# Patient Record
Sex: Male | Born: 1955
Health system: Southern US, Community
[De-identification: ages and names within clinical notes are randomized; demographics above are authoritative.]

## PROBLEM LIST (undated history)

## (undated) DIAGNOSIS — F329 Major depressive disorder, single episode, unspecified: Secondary | ICD-10-CM

## (undated) DIAGNOSIS — E782 Mixed hyperlipidemia: Secondary | ICD-10-CM

## (undated) DIAGNOSIS — E119 Type 2 diabetes mellitus without complications: Secondary | ICD-10-CM

## (undated) DIAGNOSIS — G4733 Obstructive sleep apnea (adult) (pediatric): Secondary | ICD-10-CM

## (undated) DIAGNOSIS — I714 Abdominal aortic aneurysm, without rupture, unspecified: Secondary | ICD-10-CM

## (undated) DIAGNOSIS — J449 Chronic obstructive pulmonary disease, unspecified: Secondary | ICD-10-CM

## (undated) DIAGNOSIS — K219 Gastro-esophageal reflux disease without esophagitis: Secondary | ICD-10-CM

## (undated) DIAGNOSIS — E11319 Type 2 diabetes mellitus with unspecified diabetic retinopathy without macular edema: Secondary | ICD-10-CM

## (undated) DIAGNOSIS — I639 Cerebral infarction, unspecified: Secondary | ICD-10-CM

## (undated) DIAGNOSIS — J984 Other disorders of lung: Secondary | ICD-10-CM

## (undated) DIAGNOSIS — G629 Polyneuropathy, unspecified: Secondary | ICD-10-CM

## (undated) DIAGNOSIS — H35039 Hypertensive retinopathy, unspecified eye: Secondary | ICD-10-CM

## (undated) DIAGNOSIS — I429 Cardiomyopathy, unspecified: Secondary | ICD-10-CM

## (undated) DIAGNOSIS — I340 Nonrheumatic mitral (valve) insufficiency: Secondary | ICD-10-CM

## (undated) DIAGNOSIS — I499 Cardiac arrhythmia, unspecified: Secondary | ICD-10-CM

## (undated) DIAGNOSIS — S82402B Unspecified fracture of shaft of left fibula, initial encounter for open fracture type I or II: Secondary | ICD-10-CM

## (undated) DIAGNOSIS — F32A Depression, unspecified: Secondary | ICD-10-CM

## (undated) DIAGNOSIS — I10 Essential (primary) hypertension: Secondary | ICD-10-CM

## (undated) DIAGNOSIS — H269 Unspecified cataract: Secondary | ICD-10-CM

## (undated) DIAGNOSIS — H3530 Unspecified macular degeneration: Secondary | ICD-10-CM

## (undated) DIAGNOSIS — S82202B Unspecified fracture of shaft of left tibia, initial encounter for open fracture type I or II: Secondary | ICD-10-CM

## (undated) DIAGNOSIS — D649 Anemia, unspecified: Secondary | ICD-10-CM

## (undated) HISTORY — DX: Hypertensive retinopathy, unspecified eye: H35.039

## (undated) HISTORY — PX: CHOLECYSTECTOMY: SHX55

## (undated) HISTORY — DX: Anemia, unspecified: D64.9

## (undated) HISTORY — PX: PELVIC FRACTURE SURGERY: SHX119

## (undated) HISTORY — DX: Cardiomyopathy, unspecified: I42.9

## (undated) HISTORY — DX: Other disorders of lung: J98.4

## (undated) HISTORY — DX: Gastro-esophageal reflux disease without esophagitis: K21.9

## (undated) HISTORY — PX: EXPLORATORY LAPAROTOMY: SUR591

## (undated) HISTORY — DX: Abdominal aortic aneurysm, without rupture: I71.4

## (undated) HISTORY — DX: Cerebral infarction, unspecified: I63.9

## (undated) HISTORY — DX: Depression, unspecified: F32.A

## (undated) HISTORY — PX: OTHER SURGICAL HISTORY: SHX169

## (undated) HISTORY — DX: Essential (primary) hypertension: I10

## (undated) HISTORY — DX: Mixed hyperlipidemia: E78.2

## (undated) HISTORY — DX: Type 2 diabetes mellitus without complications: E11.9

## (undated) HISTORY — PX: LEG SURGERY: SHX1003

## (undated) HISTORY — DX: Nonrheumatic mitral (valve) insufficiency: I34.0

## (undated) HISTORY — DX: Unspecified cataract: H26.9

## (undated) HISTORY — DX: Obstructive sleep apnea (adult) (pediatric): G47.33

## (undated) HISTORY — DX: Type 2 diabetes mellitus with unspecified diabetic retinopathy without macular edema: E11.319

## (undated) HISTORY — PX: VASECTOMY: SHX75

## (undated) HISTORY — PX: EYE SURGERY: SHX253

## (undated) HISTORY — DX: Cardiac arrhythmia, unspecified: I49.9

## (undated) HISTORY — DX: Unspecified macular degeneration: H35.30

## (undated) HISTORY — DX: Chronic obstructive pulmonary disease, unspecified: J44.9

## (undated) HISTORY — DX: Abdominal aortic aneurysm, without rupture, unspecified: I71.40

## (undated) HISTORY — PX: APPENDECTOMY: SHX54

## (undated) HISTORY — DX: Polyneuropathy, unspecified: G62.9

## (undated) HISTORY — DX: Major depressive disorder, single episode, unspecified: F32.9
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
## 2003-12-13 ENCOUNTER — Ambulatory Visit: Payer: Self-pay | Admitting: Psychiatry

## 2003-12-13 ENCOUNTER — Inpatient Hospital Stay (HOSPITAL_COMMUNITY): Admission: AD | Admit: 2003-12-13 | Discharge: 2003-12-17 | Payer: Self-pay | Admitting: Psychiatry

## 2007-03-08 ENCOUNTER — Emergency Department (HOSPITAL_COMMUNITY): Admission: EM | Admit: 2007-03-08 | Discharge: 2007-03-08 | Payer: Self-pay | Admitting: Emergency Medicine

## 2007-03-10 ENCOUNTER — Ambulatory Visit: Payer: Self-pay | Admitting: Internal Medicine

## 2007-03-15 ENCOUNTER — Ambulatory Visit (HOSPITAL_COMMUNITY): Admission: RE | Admit: 2007-03-15 | Discharge: 2007-03-15 | Payer: Self-pay | Admitting: Internal Medicine

## 2007-03-15 ENCOUNTER — Ambulatory Visit: Payer: Self-pay | Admitting: Cardiovascular Disease
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
## 2010-06-26 NOTE — H&P (Signed)
Paul [name], [name] NO.:  [id number]   MEDICAL RECORD NO.:  [id number]          PATIENT TYPE:  IPS   LOCATION:  0302                          FACILITY:  [facility]   PHYSICIAN:  Paul [name], M.D.      DATE OF BIRTH:  1955-12-01   DATE OF ADMISSION:  12/13/2003  DATE OF DISCHARGE:                         PSYCHIATRIC ADMISSION ASSESSMENT   IDENTIFYING INFORMATION:  This is an involuntary admission to the services  of Dr. Nicholaus [name].  This is a 56 year old married white male.  Apparently, yesterday, he called the social security disability office and  confided that he would kill himself if he did not get benefits soon.  He is  suffering from muscle cramps, emphysema.  He breaks down.  He is crying in  the office.  He has not had any income for 1-1/2-2 years.  He cannot play  with his babies.  He has two sons, ages 33 and 99 still at home.  He has eight  children in total.  His wife takes care of the children and also his mother,  who lives with them and who is blind.  He evokes empathy and reports that he  started drinking two days ago after having been sober for 15 years.  He  confides that he has tried to contact disability lawyers because he does not  want to lose his house.  However, no one has taken his case.   PAST PSYCHIATRIC HISTORY:  Back in 1990, he had outpatient at [facility] at the time his first marriage was ending.   SOCIAL HISTORY:  He went to the ninth grade.  He had worked in [employer] fund[employer].  This is his second marriage.  He has been married, he thinks, for 14-15  years.  In total, he has eight children, five daughters, three sons.  The  oldest is 73, the youngest is 75.   FAMILY HISTORY:  He states he has a sister who is all messed up.  Not sure  what her diagnosis is exactly.   ALCOHOL/DRUG HISTORY:  For the last two days, he has been drinking beer  daily.  He had been sober for 15 years.  On admission, his alcohol level was  186.   His sodium was a little bit low at 128 today and has already come up  to 131.  Will repeat it in the morning.   PRIMARY CARE PHYSICIAN:  Dr. Kern [name] in [place].   MEDICAL PROBLEMS:  He is known to be hypertensive, to have asthma and  emphysema.   CURRENT MEDICATIONS:  He takes Prilosec 20 mg q.d., Skelaxin 800 mg, 1 q.6-  8h. p.r.n., hydrochlorothiazide 12.5 mg q.d., lisinopril 20 mg q.d., Advair  250/50 mg 1 b.i.d., quinine sulfate 260 mg q.h.s., BuSpar 15 mg, 1 q.8h.  p.r.n., Cymbalta 60 mg p.o. q.d., Vytorin 10/40 1 q.d. and Spiriva 1  inhalation q.d.   ALLERGIES:  He has no known drug allergies.   POSITIVE PHYSICAL FINDINGS:  He has compromised breaths sounds consistent  with his diagnoses of asthma and emphysema.  He  is edentulous.  He appears  much older than his stated age.  Otherwise, his physical examination is well-  documented from the ER.   MENTAL STATUS EXAM:  He is alert and oriented x 3.  He is disheveled.  He  has a severe cough.  He looks markedly older than his stated age.  His gait  is normal.  His motor is normal.  His eye contact is good.  His speech had a  normal rate, rhythm and tone.  His mood is labile.  He is crying.  His  affect is congruent.  He is quite depressed and anxious.  Worried about  losing his home.  Thought processes are clear, rational and goal-oriented.  He stated he should have killed himself when he had insurance.  He did not  know he was going to get sick and let it go.  Judgment and insight are fair.  Concentration and memory are good.  Intelligence is average.  He is still  suicidal.  He does not have homicidal ideation.  He denies auditory or  visual hallucinations.   DIAGNOSES:   AXIS I:  1.  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  2.  Alcohol abuse, two days.   AXIS II:  Deferred.   AXIS III:  1.  Hypertension.  2.  Asthma.  3.  Emphysema.   AXIS IV:  Severe (economic and medical problems).   AXIS V:   30.   PLAN:  To admit for stabilization.  To detox from alcohol.  To adjust his  medications.  To line up support in the community.  Toward that end, will  add some Flovent 44 mcg 2 puffs b.i.d. to help with his cough from his  emphysema and asthma and will also add some Keppra to help augment his  Cymbalta and to help with his muscle cramps.  For some reason, Keppra seems  to be helpful in this arena.     Mick   MD/MEDQ  D:  12/14/2003  T:  12/14/2003  Job:  [id number]

## 2010-06-26 NOTE — Discharge Summary (Signed)
NAMECHADRICK, PFAFF NO.:  [id number]   MEDICAL RECORD NO.:  [id number]          PATIENT TYPE:  IPS   LOCATION:  0301                          FACILITY:  [facility]   PHYSICIAN:  Carlton Adam, M.D.      DATE OF BIRTH:  1955/11/30   DATE OF ADMISSION:  12/13/2003  DATE OF DISCHARGE:  12/17/2003                                 DISCHARGE SUMMARY   CHIEF COMPLAINT AND PRESENT ILLNESS:  This was the first admission to [facility] for this 55 year old, married, white male.  The day  before he called the service with increased irritability and confiding that  he would kill himself if he did not get any benefits.  Suffering from muscle  cramps and emphysema.  He broke down crying in the office; no income since  one and a half to two years ago.  Cannot play with his babies; has two sons,  4 and 6, still at home; has eight children in total.  His wife takes care of  the children and also his mother, who lives with them and who is blind.  He  claims he started drinking two days prior to this admission after having  been sober for 15 years.  He has tried to contact disability lawyers because  he did not want to lose his house.  However, no one has taken his case.   PAST PSYCHIATRIC HISTORY:  Back in 1990 he had outpatient treatment at  [facility] as his first marriage was ending.   ALCOHOL/DRUG HISTORY:  In the last two days, he has been drinking beer  daily.  Has been sober for 15 years.  Alcohol level was 186 upon admission.   PAST MEDICAL HISTORY:  1.  Hypertension.  2.  Asthma.  3.  Emphysema.   MEDICATIONS:  1.  Prilosec 20 mg daily.  2.  Skelaxin 800 one every six to eight hours as needed.  3.  Hydrochlorothiazide 12.5 mg daily.  4.  Lisinopril 20 mg daily.  5.  Advair 250/50 twice a day.  6.  Quinine sulfate 260 mg daily.  7.  BuSpar 15 one every eight hours.  8.  Cymbalta 60 mg daily.  9.  Vytorin 10/40 one every  day.  10. Spiriva one inhalation daily.   PHYSICAL EXAMINATION:  Performed and failed to show any acute findings.   LABORATORY WORKUP:  CBC within normal limits.  Blood chemistries within  normal limits.  Liver profile within normal limits.  TSH 3.271.  EKG normal.  Drug screen negative for substances of abuse.   MENTAL STATUS EXAM:  Reveals an alert, cooperative male, somewhat  disheveled, severe cough.  Looks markedly older than his stated age.  His  gait is normal.  Eye contact was appropriate.  His speech had a normal rate,  rhythm and tone.  Mood was labile, crying.  Affect was congruent, quite  depressed and anxious, worried about losing his home.  Thought processes  were clear, rational and goal-oriented.  Endorsed that he felt that he  should have killed  himself when he had the insurance.  Judgment and insight  are fair.  Cognition was well-preserved.   ADMISSION DIAGNOSES:   AXIS I:  1.  Major depression.  2.  Alcohol abuse.   AXIS II:  No diagnosis.   AXIS III:  1.  Hypertension.  2.  Asthma.  3.  Emphysema.   AXIS IV:  Moderate to severe.   AXIS V:  Global Assessment of Functioning upon admission was 30; highest  Global Assessment of Functioning in the last year 42.   COURSE IN HOSPITAL:  He was admitted and started in individual and group  psychotherapy.  He was given Ambien.  He was maintained on his Prilosec,  hydrochlorothiazide, lisinopril and Skelaxin.  He was given Flovent and he  was started on Keppra 500 twice a day.  He could not sleep.  Keppra was  discontinued.  He was placed on Remeron 15 at night.  Endorsed multiple  events, worries, concerns.  He claimed that he was desperate and he  considered suicide as a way to provide some income for his children, but  endorsed that he has decided to let the idea go and the children wanted him  alive more so than any amount of money that he could leave them.  He was  feeling very overwhelmed, unsupported.  As  the hospitalization progressed,  he endorsed feeling better.  His mood improved.  His affect came up,  appropriate, pleasant, more hopeful.  No suicide ideas, no homicide ideas.  Willing to pursue outpatient treatment.   DISCHARGE DIAGNOSES:   AXIS I:  1.  Major depression.  2.  Alcohol abuse.   AXIS II:  No diagnosis.   AXIS III:  1.  Hypertension.  2.  Asthma.  3.  Emphysema.   AXIS IV:  Severe.   AXIS V:  Global Assessment of Functioning upon discharge was 50/55.   DISCHARGE MEDICATIONS:  1.  Remeron 15 at night.  2.  Ambien 10 at bedtime for sleep.  3.  Skelaxin 800 one three times a day.  4.  Spiriva one inhalation daily.  5.  Zetia 10 mg at 6:00 p.m.  6.  Quinine 260 at night.  7.  Prinvil 20 mg in the morning.  8.  Flovent inhaler two puffs two times a day.  9.  Protonix 40 mg daily.  10. Hydrochlorothiazide 12.5 mg daily.  11. Advair 250/50 one puff twice a day as needed.  12. Cymbalta 60 mg daily.  13. Zocor 40 mg daily at 6:00 p.m.   FOLLOW UP:  Follow-up at [facility].     Leonidas Romberg   IL/MEDQ  D:  01/10/2004  T:  01/12/2004  Job:  [id number]

## 2010-10-29 LAB — POCT CARDIAC MARKERS
CKMB, poc: 1
CKMB, poc: <1 — ABNORMAL LOW
Myoglobin, poc: 112
Myoglobin, poc: 82.6
Operator id: 207241
Operator id: 217071
Troponin i, poc: <0.05
Troponin i, poc: <0.05

## 2010-10-29 LAB — DIFFERENTIAL
Basophils Absolute: 0.1
Basophils Relative: 1
Eosinophils Absolute: 0.4
Eosinophils Relative: 4
Lymphocytes Relative: 36
Lymphs Abs: 3.6
Monocytes Absolute: 0.7
Monocytes Relative: 7
Neutro Abs: 5.3
Neutrophils Relative %: 53

## 2010-10-29 LAB — COMPREHENSIVE METABOLIC PANEL
ALT: 25
AST: 19
Albumin: 4.1
Alkaline Phosphatase: 70
BUN: 18
CO2: 22
Calcium: 9.6
Chloride: 105
Creatinine, Ser: 1.33
GFR calc Af Amer: >60
GFR calc non Af Amer: 56 — ABNORMAL LOW
Glucose, Bld: 108 — ABNORMAL HIGH
Potassium: 4.1
Sodium: 136
Total Bilirubin: 0.5
Total Protein: 7.3

## 2010-10-29 LAB — CBC
HCT: 44.4
Hemoglobin: 15.5
MCHC: 34.9
MCV: 84.9
Platelets: 234
RBC: 5.23
RDW: 14.3
WBC: 10.1

## 2011-02-18 DIAGNOSIS — IMO0001 Reserved for inherently not codable concepts without codable children: Secondary | ICD-10-CM | POA: Diagnosis not present

## 2011-02-18 DIAGNOSIS — E782 Mixed hyperlipidemia: Secondary | ICD-10-CM | POA: Diagnosis not present

## 2011-02-25 DIAGNOSIS — D649 Anemia, unspecified: Secondary | ICD-10-CM | POA: Diagnosis not present

## 2011-02-25 DIAGNOSIS — D510 Vitamin B12 deficiency anemia due to intrinsic factor deficiency: Secondary | ICD-10-CM | POA: Diagnosis not present

## 2011-02-25 DIAGNOSIS — R0602 Shortness of breath: Secondary | ICD-10-CM | POA: Diagnosis not present

## 2011-02-25 DIAGNOSIS — R079 Chest pain, unspecified: Secondary | ICD-10-CM | POA: Diagnosis not present

## 2011-02-25 DIAGNOSIS — F17200 Nicotine dependence, unspecified, uncomplicated: Secondary | ICD-10-CM | POA: Diagnosis not present

## 2011-02-25 DIAGNOSIS — K2100 Gastro-esophageal reflux disease with esophagitis, without bleeding: Secondary | ICD-10-CM | POA: Diagnosis not present

## 2011-02-25 DIAGNOSIS — Z0000 Encounter for general adult medical examination without abnormal findings: Secondary | ICD-10-CM | POA: Diagnosis not present

## 2011-02-25 DIAGNOSIS — R5381 Other malaise: Secondary | ICD-10-CM | POA: Diagnosis not present

## 2011-02-25 DIAGNOSIS — E782 Mixed hyperlipidemia: Secondary | ICD-10-CM | POA: Diagnosis not present

## 2011-02-25 DIAGNOSIS — IMO0001 Reserved for inherently not codable concepts without codable children: Secondary | ICD-10-CM | POA: Diagnosis not present

## 2011-02-25 DIAGNOSIS — I10 Essential (primary) hypertension: Secondary | ICD-10-CM | POA: Diagnosis not present

## 2011-03-03 DIAGNOSIS — R079 Chest pain, unspecified: Secondary | ICD-10-CM | POA: Diagnosis not present

## 2011-03-03 DIAGNOSIS — E119 Type 2 diabetes mellitus without complications: Secondary | ICD-10-CM | POA: Diagnosis not present

## 2011-03-03 DIAGNOSIS — R0602 Shortness of breath: Secondary | ICD-10-CM | POA: Diagnosis not present

## 2011-03-03 DIAGNOSIS — R072 Precordial pain: Secondary | ICD-10-CM | POA: Diagnosis not present

## 2011-03-03 DIAGNOSIS — Z8249 Family history of ischemic heart disease and other diseases of the circulatory system: Secondary | ICD-10-CM | POA: Diagnosis not present

## 2011-03-03 DIAGNOSIS — I10 Essential (primary) hypertension: Secondary | ICD-10-CM | POA: Diagnosis not present

## 2011-06-01 DIAGNOSIS — E782 Mixed hyperlipidemia: Secondary | ICD-10-CM | POA: Diagnosis not present

## 2011-06-01 DIAGNOSIS — E669 Obesity, unspecified: Secondary | ICD-10-CM | POA: Diagnosis not present

## 2011-06-01 DIAGNOSIS — E039 Hypothyroidism, unspecified: Secondary | ICD-10-CM | POA: Diagnosis not present

## 2011-06-01 DIAGNOSIS — E1149 Type 2 diabetes mellitus with other diabetic neurological complication: Secondary | ICD-10-CM | POA: Diagnosis not present

## 2011-06-01 DIAGNOSIS — J309 Allergic rhinitis, unspecified: Secondary | ICD-10-CM | POA: Diagnosis not present

## 2011-06-01 DIAGNOSIS — F17200 Nicotine dependence, unspecified, uncomplicated: Secondary | ICD-10-CM | POA: Diagnosis not present

## 2011-06-01 DIAGNOSIS — K2100 Gastro-esophageal reflux disease with esophagitis, without bleeding: Secondary | ICD-10-CM | POA: Diagnosis not present

## 2011-06-01 DIAGNOSIS — I739 Peripheral vascular disease, unspecified: Secondary | ICD-10-CM | POA: Diagnosis not present

## 2011-06-01 DIAGNOSIS — IMO0002 Reserved for concepts with insufficient information to code with codable children: Secondary | ICD-10-CM | POA: Diagnosis not present

## 2011-06-01 DIAGNOSIS — I10 Essential (primary) hypertension: Secondary | ICD-10-CM | POA: Diagnosis not present

## 2011-09-09 DIAGNOSIS — IMO0001 Reserved for inherently not codable concepts without codable children: Secondary | ICD-10-CM | POA: Diagnosis not present

## 2011-09-09 DIAGNOSIS — K2100 Gastro-esophageal reflux disease with esophagitis, without bleeding: Secondary | ICD-10-CM | POA: Diagnosis not present

## 2011-09-09 DIAGNOSIS — E782 Mixed hyperlipidemia: Secondary | ICD-10-CM | POA: Diagnosis not present

## 2011-09-16 DIAGNOSIS — E782 Mixed hyperlipidemia: Secondary | ICD-10-CM | POA: Diagnosis not present

## 2011-09-16 DIAGNOSIS — K2100 Gastro-esophageal reflux disease with esophagitis, without bleeding: Secondary | ICD-10-CM | POA: Diagnosis not present

## 2011-09-16 DIAGNOSIS — IMO0002 Reserved for concepts with insufficient information to code with codable children: Secondary | ICD-10-CM | POA: Diagnosis not present

## 2011-09-16 DIAGNOSIS — I10 Essential (primary) hypertension: Secondary | ICD-10-CM | POA: Diagnosis not present

## 2011-09-16 DIAGNOSIS — F17200 Nicotine dependence, unspecified, uncomplicated: Secondary | ICD-10-CM | POA: Diagnosis not present

## 2011-09-16 DIAGNOSIS — J309 Allergic rhinitis, unspecified: Secondary | ICD-10-CM | POA: Diagnosis not present

## 2011-09-16 DIAGNOSIS — E669 Obesity, unspecified: Secondary | ICD-10-CM | POA: Diagnosis not present

## 2011-09-16 DIAGNOSIS — I739 Peripheral vascular disease, unspecified: Secondary | ICD-10-CM | POA: Diagnosis not present

## 2012-01-09 DIAGNOSIS — N320 Bladder-neck obstruction: Secondary | ICD-10-CM | POA: Diagnosis present

## 2012-01-09 DIAGNOSIS — I509 Heart failure, unspecified: Secondary | ICD-10-CM | POA: Diagnosis not present

## 2012-01-09 DIAGNOSIS — G8929 Other chronic pain: Secondary | ICD-10-CM | POA: Diagnosis present

## 2012-01-09 DIAGNOSIS — J441 Chronic obstructive pulmonary disease with (acute) exacerbation: Secondary | ICD-10-CM | POA: Diagnosis not present

## 2012-01-09 DIAGNOSIS — G4700 Insomnia, unspecified: Secondary | ICD-10-CM | POA: Diagnosis present

## 2012-01-09 DIAGNOSIS — E7800 Pure hypercholesterolemia, unspecified: Secondary | ICD-10-CM | POA: Diagnosis present

## 2012-01-09 DIAGNOSIS — M545 Low back pain, unspecified: Secondary | ICD-10-CM | POA: Diagnosis present

## 2012-01-09 DIAGNOSIS — D62 Acute posthemorrhagic anemia: Secondary | ICD-10-CM | POA: Diagnosis not present

## 2012-01-09 DIAGNOSIS — E871 Hypo-osmolality and hyponatremia: Secondary | ICD-10-CM | POA: Diagnosis not present

## 2012-01-09 DIAGNOSIS — E1142 Type 2 diabetes mellitus with diabetic polyneuropathy: Secondary | ICD-10-CM | POA: Diagnosis present

## 2012-01-09 DIAGNOSIS — E785 Hyperlipidemia, unspecified: Secondary | ICD-10-CM | POA: Diagnosis present

## 2012-01-09 DIAGNOSIS — E876 Hypokalemia: Secondary | ICD-10-CM | POA: Diagnosis present

## 2012-01-09 DIAGNOSIS — I429 Cardiomyopathy, unspecified: Secondary | ICD-10-CM | POA: Diagnosis not present

## 2012-01-09 DIAGNOSIS — Z23 Encounter for immunization: Secondary | ICD-10-CM | POA: Diagnosis not present

## 2012-01-09 DIAGNOSIS — K922 Gastrointestinal hemorrhage, unspecified: Secondary | ICD-10-CM | POA: Diagnosis not present

## 2012-01-09 DIAGNOSIS — A048 Other specified bacterial intestinal infections: Secondary | ICD-10-CM | POA: Diagnosis present

## 2012-01-09 DIAGNOSIS — F329 Major depressive disorder, single episode, unspecified: Secondary | ICD-10-CM | POA: Diagnosis present

## 2012-01-09 DIAGNOSIS — E039 Hypothyroidism, unspecified: Secondary | ICD-10-CM | POA: Diagnosis present

## 2012-01-09 DIAGNOSIS — J4489 Other specified chronic obstructive pulmonary disease: Secondary | ICD-10-CM | POA: Diagnosis not present

## 2012-01-09 DIAGNOSIS — K219 Gastro-esophageal reflux disease without esophagitis: Secondary | ICD-10-CM | POA: Diagnosis present

## 2012-01-09 DIAGNOSIS — R0602 Shortness of breath: Secondary | ICD-10-CM | POA: Diagnosis not present

## 2012-01-09 DIAGNOSIS — I10 Essential (primary) hypertension: Secondary | ICD-10-CM | POA: Diagnosis present

## 2012-01-09 DIAGNOSIS — I714 Abdominal aortic aneurysm, without rupture, unspecified: Secondary | ICD-10-CM | POA: Diagnosis not present

## 2012-01-09 DIAGNOSIS — I722 Aneurysm of renal artery: Secondary | ICD-10-CM | POA: Diagnosis not present

## 2012-01-09 DIAGNOSIS — D649 Anemia, unspecified: Secondary | ICD-10-CM | POA: Diagnosis not present

## 2012-01-09 DIAGNOSIS — F3289 Other specified depressive episodes: Secondary | ICD-10-CM | POA: Diagnosis present

## 2012-01-09 DIAGNOSIS — J449 Chronic obstructive pulmonary disease, unspecified: Secondary | ICD-10-CM | POA: Diagnosis not present

## 2012-01-09 DIAGNOSIS — I080 Rheumatic disorders of both mitral and aortic valves: Secondary | ICD-10-CM | POA: Diagnosis present

## 2012-01-09 DIAGNOSIS — G4733 Obstructive sleep apnea (adult) (pediatric): Secondary | ICD-10-CM | POA: Diagnosis present

## 2012-01-09 DIAGNOSIS — F17200 Nicotine dependence, unspecified, uncomplicated: Secondary | ICD-10-CM | POA: Diagnosis present

## 2012-01-09 DIAGNOSIS — I2789 Other specified pulmonary heart diseases: Secondary | ICD-10-CM | POA: Diagnosis present

## 2012-01-09 DIAGNOSIS — E1149 Type 2 diabetes mellitus with other diabetic neurological complication: Secondary | ICD-10-CM | POA: Diagnosis present

## 2012-01-09 DIAGNOSIS — J9600 Acute respiratory failure, unspecified whether with hypoxia or hypercapnia: Secondary | ICD-10-CM | POA: Diagnosis not present

## 2012-01-10 DIAGNOSIS — R0602 Shortness of breath: Secondary | ICD-10-CM

## 2012-01-10 DIAGNOSIS — I509 Heart failure, unspecified: Secondary | ICD-10-CM

## 2012-01-11 DIAGNOSIS — I429 Cardiomyopathy, unspecified: Secondary | ICD-10-CM

## 2012-01-12 DIAGNOSIS — I509 Heart failure, unspecified: Secondary | ICD-10-CM

## 2012-01-18 DIAGNOSIS — D509 Iron deficiency anemia, unspecified: Secondary | ICD-10-CM | POA: Diagnosis not present

## 2012-01-19 ENCOUNTER — Ambulatory Visit (INDEPENDENT_AMBULATORY_CARE_PROVIDER_SITE_OTHER): Payer: Medicare Other | Admitting: Cardiology

## 2012-01-19 ENCOUNTER — Encounter: Payer: Self-pay | Admitting: Cardiology

## 2012-01-19 VITALS — BP 139/81 | HR 71 | Ht 67.0 in | Wt 217.0 lb

## 2012-01-19 DIAGNOSIS — F17200 Nicotine dependence, unspecified, uncomplicated: Secondary | ICD-10-CM

## 2012-01-19 DIAGNOSIS — I429 Cardiomyopathy, unspecified: Secondary | ICD-10-CM | POA: Insufficient documentation

## 2012-01-19 DIAGNOSIS — E782 Mixed hyperlipidemia: Secondary | ICD-10-CM | POA: Diagnosis not present

## 2012-01-19 DIAGNOSIS — I714 Abdominal aortic aneurysm, without rupture, unspecified: Secondary | ICD-10-CM

## 2012-01-19 DIAGNOSIS — I10 Essential (primary) hypertension: Secondary | ICD-10-CM | POA: Diagnosis not present

## 2012-01-19 DIAGNOSIS — D649 Anemia, unspecified: Secondary | ICD-10-CM | POA: Insufficient documentation

## 2012-01-19 DIAGNOSIS — Z720 Tobacco use: Secondary | ICD-10-CM

## 2012-01-19 NOTE — Patient Instructions (Addendum)
Your physician recommends that you schedule a follow-up appointment in: 3 months. Your physician recommends that you continue on your current medications as directed. Please refer to the Current Medication list given to you today. Your physician has requested that you have an echocardiogram in 3 months just before your next visit. Echocardiography is a painless test that uses sound waves to create images of your heart. It provides your doctor with information about the size and shape of your heart and how well your heart's chambers and valves are working. This procedure takes approximately one hour. There are no restrictions for this procedure.

## 2012-01-19 NOTE — Assessment & Plan Note (Signed)
LVEF 45-50% by recent echocardiogram, no focal wall motion abnormalities, also associated with diastolic dysfunction and mild to moderate mitral regurgitation. Ischemic workup in January of this year did not demonstrate obvious obstructive CAD, although LVEF was better at that time. Plan at this point will be medical therapy, mostly new from a cardiac perspective following recent hospitalization, and further observation with a repeat echocardiogram over the next few months. He is on Coreg and lisinopril.

## 2012-01-19 NOTE — Assessment & Plan Note (Signed)
Long-standing history, has not been able to quit over time. Will continue to discuss with him.
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
## 2012-01-19 NOTE — Assessment & Plan Note (Signed)
On Lipitor, followed by Dr. Quillian Quince.

## 2012-01-19 NOTE — Assessment & Plan Note (Signed)
Measures 4.5 cm by recent CT done at [facility]. He has pending visit with Dr. Trula Slade for further followup, arranged by Dr. Quillian Quince.

## 2012-01-19 NOTE — Assessment & Plan Note (Signed)
Continue medical therapy 

## 2012-01-19 NOTE — Assessment & Plan Note (Signed)
Initially severe, hemoglobin of 5.7 at presentation. Denies any obvious melena or hematochezia. He has pending EGD and colonoscopy next week with Dr. Britta Mccreedy. Not on aspirin at this time.

## 2012-01-24 DIAGNOSIS — IMO0002 Reserved for concepts with insufficient information to code with codable children: Secondary | ICD-10-CM | POA: Diagnosis not present

## 2012-01-24 DIAGNOSIS — E785 Hyperlipidemia, unspecified: Secondary | ICD-10-CM | POA: Diagnosis not present

## 2012-01-24 DIAGNOSIS — K648 Other hemorrhoids: Secondary | ICD-10-CM | POA: Diagnosis not present

## 2012-01-24 DIAGNOSIS — F3289 Other specified depressive episodes: Secondary | ICD-10-CM | POA: Diagnosis not present

## 2012-01-24 DIAGNOSIS — G589 Mononeuropathy, unspecified: Secondary | ICD-10-CM | POA: Diagnosis not present

## 2012-01-24 DIAGNOSIS — E119 Type 2 diabetes mellitus without complications: Secondary | ICD-10-CM | POA: Diagnosis not present

## 2012-01-24 DIAGNOSIS — E039 Hypothyroidism, unspecified: Secondary | ICD-10-CM | POA: Diagnosis not present

## 2012-01-24 DIAGNOSIS — J4489 Other specified chronic obstructive pulmonary disease: Secondary | ICD-10-CM | POA: Diagnosis not present

## 2012-01-24 DIAGNOSIS — D509 Iron deficiency anemia, unspecified: Secondary | ICD-10-CM | POA: Diagnosis not present

## 2012-01-24 DIAGNOSIS — K449 Diaphragmatic hernia without obstruction or gangrene: Secondary | ICD-10-CM | POA: Diagnosis not present

## 2012-01-24 DIAGNOSIS — Z794 Long term (current) use of insulin: Secondary | ICD-10-CM | POA: Diagnosis not present

## 2012-01-24 DIAGNOSIS — G4700 Insomnia, unspecified: Secondary | ICD-10-CM | POA: Diagnosis not present

## 2012-01-24 DIAGNOSIS — F329 Major depressive disorder, single episode, unspecified: Secondary | ICD-10-CM | POA: Diagnosis not present

## 2012-01-24 DIAGNOSIS — G4730 Sleep apnea, unspecified: Secondary | ICD-10-CM | POA: Diagnosis not present

## 2012-01-24 DIAGNOSIS — J449 Chronic obstructive pulmonary disease, unspecified: Secondary | ICD-10-CM | POA: Diagnosis not present

## 2012-01-24 DIAGNOSIS — M62838 Other muscle spasm: Secondary | ICD-10-CM | POA: Diagnosis not present

## 2012-01-24 DIAGNOSIS — F17200 Nicotine dependence, unspecified, uncomplicated: Secondary | ICD-10-CM | POA: Diagnosis not present

## 2012-01-24 DIAGNOSIS — Z6834 Body mass index (BMI) 34.0-34.9, adult: Secondary | ICD-10-CM | POA: Diagnosis not present

## 2012-01-24 DIAGNOSIS — K649 Unspecified hemorrhoids: Secondary | ICD-10-CM | POA: Diagnosis not present

## 2012-01-24 DIAGNOSIS — Z79899 Other long term (current) drug therapy: Secondary | ICD-10-CM | POA: Diagnosis not present

## 2012-01-24 DIAGNOSIS — K219 Gastro-esophageal reflux disease without esophagitis: Secondary | ICD-10-CM | POA: Diagnosis not present

## 2012-01-24 DIAGNOSIS — K5730 Diverticulosis of large intestine without perforation or abscess without bleeding: Secondary | ICD-10-CM | POA: Diagnosis not present

## 2012-01-24 DIAGNOSIS — E663 Overweight: Secondary | ICD-10-CM | POA: Diagnosis not present

## 2012-01-24 DIAGNOSIS — I10 Essential (primary) hypertension: Secondary | ICD-10-CM | POA: Diagnosis not present

## 2012-01-31 DIAGNOSIS — I10 Essential (primary) hypertension: Secondary | ICD-10-CM | POA: Diagnosis not present

## 2012-01-31 DIAGNOSIS — E782 Mixed hyperlipidemia: Secondary | ICD-10-CM | POA: Diagnosis not present

## 2012-01-31 DIAGNOSIS — IMO0001 Reserved for inherently not codable concepts without codable children: Secondary | ICD-10-CM | POA: Diagnosis not present

## 2012-02-07 DIAGNOSIS — G4733 Obstructive sleep apnea (adult) (pediatric): Secondary | ICD-10-CM | POA: Diagnosis not present

## 2012-02-07 DIAGNOSIS — E1149 Type 2 diabetes mellitus with other diabetic neurological complication: Secondary | ICD-10-CM | POA: Diagnosis not present

## 2012-02-07 DIAGNOSIS — I10 Essential (primary) hypertension: Secondary | ICD-10-CM | POA: Diagnosis not present

## 2012-02-07 DIAGNOSIS — I739 Peripheral vascular disease, unspecified: Secondary | ICD-10-CM | POA: Diagnosis not present

## 2012-02-07 DIAGNOSIS — F17200 Nicotine dependence, unspecified, uncomplicated: Secondary | ICD-10-CM | POA: Diagnosis not present

## 2012-02-07 DIAGNOSIS — E782 Mixed hyperlipidemia: Secondary | ICD-10-CM | POA: Diagnosis not present

## 2012-02-07 DIAGNOSIS — E669 Obesity, unspecified: Secondary | ICD-10-CM | POA: Diagnosis not present

## 2012-02-07 DIAGNOSIS — E039 Hypothyroidism, unspecified: Secondary | ICD-10-CM | POA: Diagnosis not present

## 2012-02-10 ENCOUNTER — Encounter: Payer: Self-pay | Admitting: Surgery

## 2012-02-11 ENCOUNTER — Encounter: Payer: Self-pay | Admitting: Surgery

## 2012-02-11 ENCOUNTER — Ambulatory Visit (INDEPENDENT_AMBULATORY_CARE_PROVIDER_SITE_OTHER): Payer: Medicare Other | Admitting: Surgery

## 2012-02-11 VITALS — BP 150/83 | HR 81 | Resp 20 | Ht 67.0 in | Wt 220.0 lb

## 2012-02-11 DIAGNOSIS — I714 Abdominal aortic aneurysm, without rupture, unspecified: Secondary | ICD-10-CM

## 2012-02-11 DIAGNOSIS — I6529 Occlusion and stenosis of unspecified carotid artery: Secondary | ICD-10-CM

## 2012-02-11 DIAGNOSIS — M79609 Pain in unspecified limb: Secondary | ICD-10-CM
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
## 2012-02-11 NOTE — Addendum Note (Signed)
Addended by: Mena Goes on: 02/11/2012 12:34 PM   Modules accepted: Orders

## 2012-02-17 DIAGNOSIS — D509 Iron deficiency anemia, unspecified: Secondary | ICD-10-CM | POA: Diagnosis not present

## 2012-02-22 ENCOUNTER — Other Ambulatory Visit (INDEPENDENT_AMBULATORY_CARE_PROVIDER_SITE_OTHER): Payer: Self-pay | Admitting: *Deleted

## 2012-02-22 DIAGNOSIS — K922 Gastrointestinal hemorrhage, unspecified: Secondary | ICD-10-CM

## 2012-02-22 DIAGNOSIS — D509 Iron deficiency anemia, unspecified: Secondary | ICD-10-CM

## 2012-02-23 ENCOUNTER — Encounter (HOSPITAL_COMMUNITY): Payer: Self-pay | Admitting: Pharmacy Technician

## 2012-02-24 ENCOUNTER — Encounter (HOSPITAL_COMMUNITY): Payer: Self-pay | Admitting: Pharmacy Technician

## 2012-02-29 ENCOUNTER — Encounter (HOSPITAL_COMMUNITY)
Admission: RE | Disposition: A | Discharge status: Home / Self Care | Payer: Self-pay | Source: Ambulatory Visit | Attending: Internal Medicine

## 2012-02-29 ENCOUNTER — Ambulatory Visit (HOSPITAL_COMMUNITY)
Admission: RE | Admit: 2012-02-29 | Discharge: 2012-02-29 | Disposition: A | Discharge status: Home / Self Care | Payer: Medicare Other | Source: Ambulatory Visit | Attending: Internal Medicine | Admitting: Internal Medicine

## 2012-02-29 DIAGNOSIS — J4489 Other specified chronic obstructive pulmonary disease: Secondary | ICD-10-CM | POA: Insufficient documentation

## 2012-02-29 DIAGNOSIS — K633 Ulcer of intestine: Secondary | ICD-10-CM | POA: Diagnosis not present

## 2012-02-29 DIAGNOSIS — K922 Gastrointestinal hemorrhage, unspecified: Secondary | ICD-10-CM | POA: Diagnosis not present

## 2012-02-29 DIAGNOSIS — J449 Chronic obstructive pulmonary disease, unspecified: Secondary | ICD-10-CM | POA: Insufficient documentation

## 2012-02-29 DIAGNOSIS — E119 Type 2 diabetes mellitus without complications: Secondary | ICD-10-CM | POA: Diagnosis not present

## 2012-02-29 DIAGNOSIS — D509 Iron deficiency anemia, unspecified: Secondary | ICD-10-CM | POA: Insufficient documentation

## 2012-02-29 DIAGNOSIS — I10 Essential (primary) hypertension: Secondary | ICD-10-CM | POA: Insufficient documentation

## 2012-02-29 HISTORY — PX: GIVENS CAPSULE STUDY: SHX5432

## 2012-02-29 SURGERY — IMAGING PROCEDURE, GI TRACT, INTRALUMINAL, VIA CAPSULE

## 2012-02-29 NOTE — OR Nursing (Signed)
Patient stated ht  5'8 and weight 214.6.  Last ASA and iron 02/16/12. Waist measured at 46 in.  Patient was given instructions about drinking clear liquids after 2 hours and may have a 1/2 of a sandwich after 4 hours.  Patient swallowed the capsule without difficulty.  Patient was instructed to return at 3:30 with the device.

## 2012-02-29 NOTE — OR Nursing (Signed)
Capsule lot #[id number]. [id number]
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
## 2012-03-02 ENCOUNTER — Encounter (INDEPENDENT_AMBULATORY_CARE_PROVIDER_SITE_OTHER): Payer: Self-pay

## 2012-03-02 NOTE — Op Note (Signed)
Small Bowel Givens Capsule Study Procedure date:  02/29/2012.  Referring Provider:  Dr. Doristine Mango, MD. PCP:  Dr. Gar Ponto, MD.  Indication for procedure:  Patient is 57 year old male with history of GI bleed and iron deficiency anemia. He was admitted to [facility] in [facility] with excess sedation of COPD and a hemoglobin of less than 6 g. He received total of 6 units of PRBCs. He was evaluated by Dr. Doristine Mango of GI service and underwent outpatient EGD and colonoscopy but no bleeding source was identified. He is therefore referred for small bowel given capsule study. Patient's aspirin dose has been decreased from 325 mg 281 mg and he has not experienced any melena or rectal bleeding. Patient's iron was discontinued in preparation for this study.    Findings:  Patient was able to swallow given capsule without any difficulty.  First Gastric image:  1 minutes and 26 sec. First Duodenal image: 46 minutes and 27 sec. First Ileo-Cecal Valve image: 5 hours, 14 minutes and 19secs First Cecal image: 5 hours, 14 minutes and 20 sec. Gastric Passage time: 45 minutes Small Bowel Passage time:  4 hours and 38 minutes.   Summary & Recommendations: Small benign appearing ulcer noted in jejunum without stigmata of bleed(53 minutes and 25 sec). 3 small ulcers with volcano appearance located in close proximity best seen on 4 images(one hour 8 minutes 10 seconds through 14 seconds). While these could be benign ulcers, small bowel carcinoid remains in differential diagnosis. Will request Dr. Olegario Messier of [facility] to review these images and determine if he should undergo small bowel endoscopy or repeat exam in 3-6 months. Findings and recommendations reviewed with patient over the phone.   CC; Dr. Doristine Mango MD         Dr. Gar Ponto MD

## 2012-03-03 ENCOUNTER — Encounter (HOSPITAL_COMMUNITY): Payer: Self-pay | Admitting: Internal Medicine

## 2012-04-03 ENCOUNTER — Telehealth (INDEPENDENT_AMBULATORY_CARE_PROVIDER_SITE_OTHER): Payer: Self-pay | Admitting: *Deleted

## 2012-04-03 DIAGNOSIS — D509 Iron deficiency anemia, unspecified: Secondary | ICD-10-CM | POA: Diagnosis not present

## 2012-04-03 DIAGNOSIS — E538 Deficiency of other specified B group vitamins: Secondary | ICD-10-CM | POA: Diagnosis not present

## 2012-04-03 NOTE — Telephone Encounter (Signed)
Lab request completed and will be faxed to Dr.Daniel's office.

## 2012-04-03 NOTE — Telephone Encounter (Signed)
Lab work per [employer].

## 2012-04-03 NOTE — Telephone Encounter (Signed)
Wife LM stating Dr. Laural Golden needed Paul [name] to have a H&H lab work done. Would like to get the order faxed to Dr. Coralyn Mark Daniel's Office. Patient's return phone number is [phone].

## 2012-04-12 ENCOUNTER — Other Ambulatory Visit (INDEPENDENT_AMBULATORY_CARE_PROVIDER_SITE_OTHER): Payer: Medicare Other

## 2012-04-12 ENCOUNTER — Other Ambulatory Visit: Payer: Self-pay

## 2012-04-12 DIAGNOSIS — I428 Other cardiomyopathies: Secondary | ICD-10-CM | POA: Diagnosis not present

## 2012-04-12 DIAGNOSIS — I429 Cardiomyopathy, unspecified: Secondary | ICD-10-CM

## 2012-04-17 ENCOUNTER — Telehealth: Payer: Self-pay | Admitting: *Deleted

## 2012-04-17 NOTE — Telephone Encounter (Signed)
Message copied by Laurine Blazer on Mon Apr 17, 2012  5:40 PM ------      Message from: MCDOWELL, Aloha Gell      Created: Wed Apr 12, 2012 10:09 AM       Reviewed. LVEF now in normal range at 60% with grade 1 diastolic dysfunction. There is no description of significant mitral regurgitation. Would plan to continue medical therapy and followup. ------

## 2012-04-17 NOTE — Telephone Encounter (Signed)
Notes Recorded by Laurine Blazer, LPN on [id number] at [phone] PM Patient notified. Patient already has follow up scheduled this Thursday, 3/13 with Dr. Domenic Polite.

## 2012-04-20 ENCOUNTER — Encounter: Payer: Self-pay | Admitting: Cardiology

## 2012-04-20 ENCOUNTER — Ambulatory Visit (INDEPENDENT_AMBULATORY_CARE_PROVIDER_SITE_OTHER): Payer: Medicare Other | Admitting: Cardiology

## 2012-04-20 VITALS — BP 117/81 | HR 85 | Wt 227.0 lb

## 2012-04-20 DIAGNOSIS — I714 Abdominal aortic aneurysm, without rupture, unspecified: Secondary | ICD-10-CM | POA: Diagnosis not present

## 2012-04-20 DIAGNOSIS — I429 Cardiomyopathy, unspecified: Secondary | ICD-10-CM

## 2012-04-20 DIAGNOSIS — I10 Essential (primary) hypertension: Secondary | ICD-10-CM

## 2012-04-20 DIAGNOSIS — I059 Rheumatic mitral valve disease, unspecified: Secondary | ICD-10-CM

## 2012-04-20 DIAGNOSIS — I340 Nonrheumatic mitral (valve) insufficiency: Secondary | ICD-10-CM

## 2012-04-20 NOTE — Patient Instructions (Signed)
Continue all current medications. Your physician wants you to follow up in: 6 months.  You will receive a reminder letter in the mail one-two months in advance.  If you don't receive a letter, please call our office to schedule the follow up appointment   

## 2012-04-20 NOTE — Assessment & Plan Note (Signed)
LVEF has normalized based on recent followup echocardiogram. Plan to continue medical therapy and observation.

## 2012-04-20 NOTE — Assessment & Plan Note (Signed)
Blood pressure is normal today. 

## 2012-04-20 NOTE — Progress Notes (Signed)
Clinical Summary [name] is a 57 y.o.male last seen in December 2013. He is here with his wife today. Reports stable NYHA class II dyspnea. Continues to follow with Paul [name] with what sounds like identified source of GI bleeding being the small bowel based on patient description.  Followup echocardiogram done on 3/5 showed normalization of LV systolic function, EF [id number] with mild LVH, grade 1 diastolic dysfunction, mild aortic regurgitation, mild to moderate left atrial enlargement, no mention of significant mitral regurgitation at this pont. We discussed the results today.  He has followup with [name] for his abdominal aortic aneurysm, was seen in January.  He reports compliance with his cardiac medications.  No Known Allergies  Current Outpatient Prescriptions  Medication Sig Dispense Refill  . ADVAIR DISKUS 250-50 MCG/DOSE AEPB Inhale 1 puff into the lungs Twice daily.      Marland Kitchen aspirin 325 MG EC tablet Take 325 mg by mouth daily.      Marland Kitchen atorvastatin (LIPITOR) 10 MG tablet Take 1 tablet by mouth Daily.      . carvedilol (COREG) 12.5 MG tablet Take 1 tablet by mouth Twice daily.      . furosemide (LASIX) 20 MG tablet Take 1 tablet by mouth Daily.      Marland Kitchen gabapentin (NEURONTIN) 300 MG capsule Take 1 capsule by mouth Three times a day.      . iron polysaccharides (NU-IRON) 150 MG capsule Take 150 mg by mouth 2 (two) times daily.      Marland Kitchen LANTUS SOLOSTAR 100 UNIT/ML injection Inject 100 Units into the skin Daily.      Marland Kitchen levothyroxine (SYNTHROID, LEVOTHROID) 50 MCG tablet Take 1 tablet by mouth Daily.      Marland Kitchen lisinopril (PRINIVIL,ZESTRIL) 40 MG tablet Take 1 tablet by mouth Daily.      . montelukast (SINGULAIR) 10 MG tablet Take 1 tablet by mouth Daily.      Marland Kitchen omeprazole (PRILOSEC) 20 MG capsule Take 1 capsule by mouth Daily.      Marland Kitchen SPIRIVA HANDIHALER 18 MCG inhalation capsule Place 1 puff into inhaler and inhale Daily.      . Tamsulosin HCl (FLOMAX) 0.4 MG CAPS Take 0.4 mg by mouth at  bedtime.      Marland Kitchen tiZANidine (ZANAFLEX) 4 MG tablet Take 1 tablet by mouth 3 (three) times daily.       Marland Kitchen venlafaxine XR (EFFEXOR-XR) 150 MG 24 hr capsule Take 1 tablet by mouth Daily.      . VENTOLIN HFA 108 (90 BASE) MCG/ACT inhaler Inhale 2 puffs into the lungs Every 4 hours as needed. For shortness of breath      . zolpidem (AMBIEN) 10 MG tablet Take 1 tablet by mouth Daily.       No current facility-administered medications for this visit.    Past Medical History  Diagnosis Date  . Mixed hyperlipidemia   . GERD (gastroesophageal reflux disease)   . Essential hypertension, benign   . Type 2 diabetes mellitus   . Depression   . Obstructive sleep apnea   . COPD (chronic obstructive pulmonary disease)   . Peripheral neuropathy   . Abdominal aortic aneurysm      4.5 cm by CT 12/3  . Cardiomyopathy      LVEF 45-50% by echocardiiogrram 12/2  . Mitral regurgitation      Mild to moderate    Social History Paul [name] reports that he has been smoking Cigarettes.  He started smoking about 53 years  ago. He has a 15.6 pack-year smoking history. He quit smokeless tobacco use about 33 years ago. His smokeless tobacco use included Chew. Paul [name] reports that he does not drink alcohol.  Review of Systems No palpitations or syncope. States he occasionally notices a small amount of blood in his stools. Otherwise negative.  Physical Examination Filed Vitals:   04/20/12 0831  BP: 117/81  Pulse: 85   Filed Weights   04/20/12 0831  Weight: 227 lb (102.967 kg)    No acute distress.  HEENT: Conjunctiva and lids normal, oropharynx clear with poor dentition.  Neck: Supple, no elevated JVP or carotid bruits, no thyromegaly.  Lungs: Clear to auscultation, decreased breath sounds, nonlabored breathing at rest.  Cardiac: Regular rate and rhythm, no S3, soft apical systolic murmur, no pericardial rub.  Abdomen: Soft, nontender, bowel sounds present, no guarding or rebound.  Extremities: No  pitting edema, distal pulses 1-2+.  Skin: Warm and dry.  Musculoskeletal: No kyphosis.  Neuropsychiatric: Alert and oriented x3, affect grossly appropriate.   Problem List and Plan   Secondary cardiomyopathy, unspecified LVEF has normalized based on recent followup echocardiogram. Plan to continue medical therapy and observation.  Mitral regurgitation No significant degree of mitral regurgitation described on recent echocardiogram.  Essential hypertension, benign Blood pressure is normal today.  AAA (abdominal aortic aneurysm) Followed by [name].    Satira Sark, M.D., F.A.C.C.

## 2012-04-20 NOTE — Assessment & Plan Note (Signed)
No significant degree of mitral regurgitation described on recent echocardiogram.

## 2012-04-20 NOTE — Assessment & Plan Note (Signed)
Followed by Dr Brabham 

## 2012-05-02 DIAGNOSIS — I10 Essential (primary) hypertension: Secondary | ICD-10-CM | POA: Diagnosis not present

## 2012-05-02 DIAGNOSIS — F17200 Nicotine dependence, unspecified, uncomplicated: Secondary | ICD-10-CM | POA: Diagnosis not present

## 2012-05-02 DIAGNOSIS — IMO0001 Reserved for inherently not codable concepts without codable children: Secondary | ICD-10-CM | POA: Diagnosis not present

## 2012-05-02 DIAGNOSIS — E039 Hypothyroidism, unspecified: Secondary | ICD-10-CM | POA: Diagnosis not present

## 2012-05-02 DIAGNOSIS — E782 Mixed hyperlipidemia: Secondary | ICD-10-CM | POA: Diagnosis not present

## 2012-05-09 DIAGNOSIS — G4733 Obstructive sleep apnea (adult) (pediatric): Secondary | ICD-10-CM | POA: Diagnosis not present

## 2012-05-09 DIAGNOSIS — E039 Hypothyroidism, unspecified: Secondary | ICD-10-CM | POA: Diagnosis not present

## 2012-05-09 DIAGNOSIS — E782 Mixed hyperlipidemia: Secondary | ICD-10-CM | POA: Diagnosis not present

## 2012-05-09 DIAGNOSIS — I10 Essential (primary) hypertension: Secondary | ICD-10-CM | POA: Diagnosis not present

## 2012-05-09 DIAGNOSIS — E1149 Type 2 diabetes mellitus with other diabetic neurological complication: Secondary | ICD-10-CM | POA: Diagnosis not present

## 2012-05-09 DIAGNOSIS — F17200 Nicotine dependence, unspecified, uncomplicated: Secondary | ICD-10-CM | POA: Diagnosis not present

## 2012-05-09 DIAGNOSIS — I739 Peripheral vascular disease, unspecified: Secondary | ICD-10-CM | POA: Diagnosis not present

## 2012-05-09 DIAGNOSIS — E669 Obesity, unspecified: Secondary | ICD-10-CM | POA: Diagnosis not present

## 2012-05-16 DIAGNOSIS — H52229 Regular astigmatism, unspecified eye: Secondary | ICD-10-CM | POA: Diagnosis not present

## 2012-05-16 DIAGNOSIS — E119 Type 2 diabetes mellitus without complications: Secondary | ICD-10-CM | POA: Diagnosis not present

## 2012-05-16 DIAGNOSIS — H524 Presbyopia: Secondary | ICD-10-CM | POA: Diagnosis not present

## 2012-05-16 DIAGNOSIS — H5200 Hypermetropia, unspecified eye: Secondary | ICD-10-CM | POA: Diagnosis not present

## 2012-08-10 ENCOUNTER — Encounter: Payer: Self-pay | Admitting: Surgery

## 2012-08-14 ENCOUNTER — Ambulatory Visit (INDEPENDENT_AMBULATORY_CARE_PROVIDER_SITE_OTHER): Payer: Medicare Other | Admitting: Surgery

## 2012-08-14 ENCOUNTER — Encounter (INDEPENDENT_AMBULATORY_CARE_PROVIDER_SITE_OTHER): Payer: Medicare Other | Admitting: *Deleted

## 2012-08-14 ENCOUNTER — Other Ambulatory Visit (INDEPENDENT_AMBULATORY_CARE_PROVIDER_SITE_OTHER): Payer: Medicare Other | Admitting: *Deleted

## 2012-08-14 ENCOUNTER — Encounter: Payer: Self-pay | Admitting: Surgery

## 2012-08-14 ENCOUNTER — Other Ambulatory Visit: Payer: Self-pay | Admitting: *Deleted

## 2012-08-14 VITALS — BP 155/97 | HR 75 | Resp 16 | Ht 66.5 in | Wt 226.0 lb

## 2012-08-14 DIAGNOSIS — I714 Abdominal aortic aneurysm, without rupture, unspecified: Secondary | ICD-10-CM

## 2012-08-14 DIAGNOSIS — Z48812 Encounter for surgical aftercare following surgery on the circulatory system: Secondary | ICD-10-CM

## 2012-08-14 DIAGNOSIS — R209 Unspecified disturbances of skin sensation: Secondary | ICD-10-CM

## 2012-08-14 DIAGNOSIS — I6529 Occlusion and stenosis of unspecified carotid artery: Secondary | ICD-10-CM | POA: Diagnosis not present

## 2012-08-14 DIAGNOSIS — M79609 Pain in unspecified limb: Secondary | ICD-10-CM

## 2012-08-14 DIAGNOSIS — R200 Anesthesia of skin: Secondary | ICD-10-CM

## 2012-08-14 DIAGNOSIS — R202 Paresthesia of skin: Secondary | ICD-10-CM
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
## 2012-09-15 DIAGNOSIS — E669 Obesity, unspecified: Secondary | ICD-10-CM | POA: Diagnosis not present

## 2012-09-15 DIAGNOSIS — IMO0001 Reserved for inherently not codable concepts without codable children: Secondary | ICD-10-CM | POA: Diagnosis not present

## 2012-09-15 DIAGNOSIS — F17200 Nicotine dependence, unspecified, uncomplicated: Secondary | ICD-10-CM | POA: Diagnosis not present

## 2012-09-15 DIAGNOSIS — E782 Mixed hyperlipidemia: Secondary | ICD-10-CM | POA: Diagnosis not present

## 2012-09-15 DIAGNOSIS — E039 Hypothyroidism, unspecified: Secondary | ICD-10-CM | POA: Diagnosis not present

## 2012-09-15 DIAGNOSIS — I10 Essential (primary) hypertension: Secondary | ICD-10-CM | POA: Diagnosis not present

## 2012-09-15 DIAGNOSIS — D649 Anemia, unspecified: Secondary | ICD-10-CM | POA: Diagnosis not present

## 2012-09-21 DIAGNOSIS — E782 Mixed hyperlipidemia: Secondary | ICD-10-CM | POA: Diagnosis not present

## 2012-09-21 DIAGNOSIS — E669 Obesity, unspecified: Secondary | ICD-10-CM | POA: Diagnosis not present

## 2012-09-21 DIAGNOSIS — I739 Peripheral vascular disease, unspecified: Secondary | ICD-10-CM | POA: Diagnosis not present

## 2012-09-21 DIAGNOSIS — E1149 Type 2 diabetes mellitus with other diabetic neurological complication: Secondary | ICD-10-CM | POA: Diagnosis not present

## 2012-09-21 DIAGNOSIS — G4733 Obstructive sleep apnea (adult) (pediatric): Secondary | ICD-10-CM | POA: Diagnosis not present

## 2012-09-21 DIAGNOSIS — E039 Hypothyroidism, unspecified: Secondary | ICD-10-CM | POA: Diagnosis not present

## 2012-09-21 DIAGNOSIS — F17200 Nicotine dependence, unspecified, uncomplicated: Secondary | ICD-10-CM | POA: Diagnosis not present

## 2012-09-21 DIAGNOSIS — I10 Essential (primary) hypertension: Secondary | ICD-10-CM | POA: Diagnosis not present

## 2012-09-25 DIAGNOSIS — G43909 Migraine, unspecified, not intractable, without status migrainosus: Secondary | ICD-10-CM | POA: Diagnosis not present

## 2012-09-25 DIAGNOSIS — J328 Other chronic sinusitis: Secondary | ICD-10-CM | POA: Diagnosis not present

## 2012-09-25 DIAGNOSIS — J320 Chronic maxillary sinusitis: Secondary | ICD-10-CM | POA: Diagnosis not present

## 2012-09-25 DIAGNOSIS — H532 Diplopia: Secondary | ICD-10-CM | POA: Diagnosis not present

## 2012-09-25 DIAGNOSIS — R51 Headache: Secondary | ICD-10-CM | POA: Diagnosis not present

## 2012-12-05 DIAGNOSIS — Z23 Encounter for immunization: Secondary | ICD-10-CM | POA: Diagnosis not present

## 2012-12-06 ENCOUNTER — Encounter: Payer: Self-pay | Admitting: Cardiology

## 2013-01-03 ENCOUNTER — Encounter: Payer: Self-pay | Admitting: Cardiology

## 2013-01-15 ENCOUNTER — Other Ambulatory Visit: Payer: Medicare Other

## 2013-01-15 ENCOUNTER — Ambulatory Visit: Payer: Medicare Other | Admitting: Surgery

## 2013-01-22 ENCOUNTER — Ambulatory Visit: Payer: Medicare Other | Admitting: Surgery

## 2013-01-26 DIAGNOSIS — E669 Obesity, unspecified: Secondary | ICD-10-CM | POA: Diagnosis not present

## 2013-01-26 DIAGNOSIS — I10 Essential (primary) hypertension: Secondary | ICD-10-CM | POA: Diagnosis not present

## 2013-01-26 DIAGNOSIS — IMO0001 Reserved for inherently not codable concepts without codable children: Secondary | ICD-10-CM | POA: Diagnosis not present

## 2013-01-26 DIAGNOSIS — D649 Anemia, unspecified: Secondary | ICD-10-CM | POA: Diagnosis not present

## 2013-01-26 DIAGNOSIS — F17200 Nicotine dependence, unspecified, uncomplicated: Secondary | ICD-10-CM | POA: Diagnosis not present

## 2013-01-26 DIAGNOSIS — E782 Mixed hyperlipidemia: Secondary | ICD-10-CM | POA: Diagnosis not present

## 2013-01-26 DIAGNOSIS — E039 Hypothyroidism, unspecified: Secondary | ICD-10-CM | POA: Diagnosis not present

## 2013-02-02 DIAGNOSIS — G4733 Obstructive sleep apnea (adult) (pediatric): Secondary | ICD-10-CM | POA: Diagnosis not present

## 2013-02-02 DIAGNOSIS — I10 Essential (primary) hypertension: Secondary | ICD-10-CM | POA: Diagnosis not present

## 2013-02-02 DIAGNOSIS — E782 Mixed hyperlipidemia: Secondary | ICD-10-CM | POA: Diagnosis not present

## 2013-02-02 DIAGNOSIS — E039 Hypothyroidism, unspecified: Secondary | ICD-10-CM | POA: Diagnosis not present

## 2013-02-02 DIAGNOSIS — E669 Obesity, unspecified: Secondary | ICD-10-CM | POA: Diagnosis not present

## 2013-02-02 DIAGNOSIS — E1149 Type 2 diabetes mellitus with other diabetic neurological complication: Secondary | ICD-10-CM | POA: Diagnosis not present

## 2013-02-02 DIAGNOSIS — I739 Peripheral vascular disease, unspecified: Secondary | ICD-10-CM | POA: Diagnosis not present

## 2013-02-02 DIAGNOSIS — F17200 Nicotine dependence, unspecified, uncomplicated: Secondary | ICD-10-CM | POA: Diagnosis not present

## 2013-02-06 DIAGNOSIS — I714 Abdominal aortic aneurysm, without rupture, unspecified: Secondary | ICD-10-CM | POA: Diagnosis not present

## 2013-02-22 DIAGNOSIS — G459 Transient cerebral ischemic attack, unspecified: Secondary | ICD-10-CM | POA: Diagnosis not present

## 2013-02-22 DIAGNOSIS — IMO0002 Reserved for concepts with insufficient information to code with codable children: Secondary | ICD-10-CM | POA: Diagnosis not present

## 2013-02-22 DIAGNOSIS — K219 Gastro-esophageal reflux disease without esophagitis: Secondary | ICD-10-CM | POA: Diagnosis not present

## 2013-02-22 DIAGNOSIS — E7800 Pure hypercholesterolemia, unspecified: Secondary | ICD-10-CM | POA: Diagnosis not present

## 2013-02-22 DIAGNOSIS — R209 Unspecified disturbances of skin sensation: Secondary | ICD-10-CM | POA: Diagnosis not present

## 2013-02-22 DIAGNOSIS — F329 Major depressive disorder, single episode, unspecified: Secondary | ICD-10-CM | POA: Diagnosis not present

## 2013-02-22 DIAGNOSIS — J4489 Other specified chronic obstructive pulmonary disease: Secondary | ICD-10-CM | POA: Diagnosis not present

## 2013-02-22 DIAGNOSIS — Z7982 Long term (current) use of aspirin: Secondary | ICD-10-CM | POA: Diagnosis not present

## 2013-02-22 DIAGNOSIS — E119 Type 2 diabetes mellitus without complications: Secondary | ICD-10-CM | POA: Diagnosis not present

## 2013-02-22 DIAGNOSIS — I10 Essential (primary) hypertension: Secondary | ICD-10-CM | POA: Diagnosis not present

## 2013-02-22 DIAGNOSIS — F3289 Other specified depressive episodes: Secondary | ICD-10-CM | POA: Diagnosis not present

## 2013-02-22 DIAGNOSIS — Z794 Long term (current) use of insulin: Secondary | ICD-10-CM | POA: Diagnosis not present

## 2013-02-22 DIAGNOSIS — Z79899 Other long term (current) drug therapy: Secondary | ICD-10-CM | POA: Diagnosis not present

## 2013-02-22 DIAGNOSIS — E039 Hypothyroidism, unspecified: Secondary | ICD-10-CM | POA: Diagnosis not present

## 2013-02-22 DIAGNOSIS — Z833 Family history of diabetes mellitus: Secondary | ICD-10-CM | POA: Diagnosis not present

## 2013-02-22 DIAGNOSIS — J449 Chronic obstructive pulmonary disease, unspecified: Secondary | ICD-10-CM | POA: Diagnosis not present

## 2013-02-22 DIAGNOSIS — Z8249 Family history of ischemic heart disease and other diseases of the circulatory system: Secondary | ICD-10-CM | POA: Diagnosis not present

## 2013-02-23 DIAGNOSIS — G459 Transient cerebral ischemic attack, unspecified: Secondary | ICD-10-CM | POA: Diagnosis not present

## 2013-02-27 DIAGNOSIS — R209 Unspecified disturbances of skin sensation: Secondary | ICD-10-CM | POA: Diagnosis not present

## 2013-02-27 DIAGNOSIS — R4701 Aphasia: Secondary | ICD-10-CM | POA: Diagnosis not present

## 2013-02-27 DIAGNOSIS — I6350 Cerebral infarction due to unspecified occlusion or stenosis of unspecified cerebral artery: Secondary | ICD-10-CM | POA: Diagnosis not present

## 2013-02-27 DIAGNOSIS — I6789 Other cerebrovascular disease: Secondary | ICD-10-CM | POA: Diagnosis not present

## 2013-03-08 DIAGNOSIS — I658 Occlusion and stenosis of other precerebral arteries: Secondary | ICD-10-CM | POA: Diagnosis not present

## 2013-03-08 DIAGNOSIS — G459 Transient cerebral ischemic attack, unspecified: Secondary | ICD-10-CM | POA: Diagnosis not present

## 2013-04-25 ENCOUNTER — Other Ambulatory Visit: Payer: Self-pay | Admitting: *Deleted

## 2013-04-25 DIAGNOSIS — H2510 Age-related nuclear cataract, unspecified eye: Secondary | ICD-10-CM | POA: Diagnosis not present

## 2013-04-25 DIAGNOSIS — E119 Type 2 diabetes mellitus without complications: Secondary | ICD-10-CM | POA: Diagnosis not present

## 2013-04-25 DIAGNOSIS — H538 Other visual disturbances: Secondary | ICD-10-CM | POA: Diagnosis not present

## 2013-04-25 DIAGNOSIS — H53469 Homonymous bilateral field defects, unspecified side: Secondary | ICD-10-CM | POA: Diagnosis not present

## 2013-05-30 DIAGNOSIS — F17200 Nicotine dependence, unspecified, uncomplicated: Secondary | ICD-10-CM | POA: Diagnosis not present

## 2013-05-30 DIAGNOSIS — I10 Essential (primary) hypertension: Secondary | ICD-10-CM | POA: Diagnosis not present

## 2013-05-30 DIAGNOSIS — E039 Hypothyroidism, unspecified: Secondary | ICD-10-CM | POA: Diagnosis not present

## 2013-05-30 DIAGNOSIS — E782 Mixed hyperlipidemia: Secondary | ICD-10-CM | POA: Diagnosis not present

## 2013-05-30 DIAGNOSIS — D649 Anemia, unspecified: Secondary | ICD-10-CM | POA: Diagnosis not present

## 2013-05-30 DIAGNOSIS — IMO0001 Reserved for inherently not codable concepts without codable children: Secondary | ICD-10-CM | POA: Diagnosis not present

## 2013-05-30 DIAGNOSIS — E669 Obesity, unspecified: Secondary | ICD-10-CM | POA: Diagnosis not present

## 2013-06-06 DIAGNOSIS — E1149 Type 2 diabetes mellitus with other diabetic neurological complication: Secondary | ICD-10-CM | POA: Diagnosis not present

## 2013-06-06 DIAGNOSIS — I10 Essential (primary) hypertension: Secondary | ICD-10-CM | POA: Diagnosis not present

## 2013-06-06 DIAGNOSIS — F17200 Nicotine dependence, unspecified, uncomplicated: Secondary | ICD-10-CM | POA: Diagnosis not present

## 2013-06-06 DIAGNOSIS — E782 Mixed hyperlipidemia: Secondary | ICD-10-CM | POA: Diagnosis not present

## 2013-06-06 DIAGNOSIS — I739 Peripheral vascular disease, unspecified: Secondary | ICD-10-CM | POA: Diagnosis not present

## 2013-06-06 DIAGNOSIS — G4733 Obstructive sleep apnea (adult) (pediatric): Secondary | ICD-10-CM | POA: Diagnosis not present

## 2013-06-06 DIAGNOSIS — E669 Obesity, unspecified: Secondary | ICD-10-CM | POA: Diagnosis not present

## 2013-06-06 DIAGNOSIS — E039 Hypothyroidism, unspecified: Secondary | ICD-10-CM | POA: Diagnosis not present

## 2013-06-09 DIAGNOSIS — C44320 Squamous cell carcinoma of skin of unspecified parts of face: Secondary | ICD-10-CM | POA: Diagnosis not present

## 2013-08-16 DIAGNOSIS — I714 Abdominal aortic aneurysm, without rupture, unspecified: Secondary | ICD-10-CM | POA: Diagnosis not present

## 2013-09-07 DIAGNOSIS — E782 Mixed hyperlipidemia: Secondary | ICD-10-CM | POA: Diagnosis not present

## 2013-09-07 DIAGNOSIS — D649 Anemia, unspecified: Secondary | ICD-10-CM | POA: Diagnosis not present

## 2013-09-07 DIAGNOSIS — IMO0001 Reserved for inherently not codable concepts without codable children: Secondary | ICD-10-CM | POA: Diagnosis not present

## 2013-09-07 DIAGNOSIS — E039 Hypothyroidism, unspecified: Secondary | ICD-10-CM | POA: Diagnosis not present

## 2013-09-14 DIAGNOSIS — I10 Essential (primary) hypertension: Secondary | ICD-10-CM | POA: Diagnosis not present

## 2013-09-14 DIAGNOSIS — E039 Hypothyroidism, unspecified: Secondary | ICD-10-CM | POA: Diagnosis not present

## 2013-09-14 DIAGNOSIS — G4733 Obstructive sleep apnea (adult) (pediatric): Secondary | ICD-10-CM | POA: Diagnosis not present

## 2013-09-14 DIAGNOSIS — E1149 Type 2 diabetes mellitus with other diabetic neurological complication: Secondary | ICD-10-CM | POA: Diagnosis not present

## 2013-09-14 DIAGNOSIS — E669 Obesity, unspecified: Secondary | ICD-10-CM | POA: Diagnosis not present

## 2013-09-14 DIAGNOSIS — I739 Peripheral vascular disease, unspecified: Secondary | ICD-10-CM | POA: Diagnosis not present

## 2013-09-14 DIAGNOSIS — F17200 Nicotine dependence, unspecified, uncomplicated: Secondary | ICD-10-CM | POA: Diagnosis not present

## 2013-09-14 DIAGNOSIS — E782 Mixed hyperlipidemia: Secondary | ICD-10-CM | POA: Diagnosis not present

## 2013-10-23 DIAGNOSIS — J209 Acute bronchitis, unspecified: Secondary | ICD-10-CM | POA: Diagnosis not present

## 2013-10-23 DIAGNOSIS — R0609 Other forms of dyspnea: Secondary | ICD-10-CM | POA: Diagnosis not present

## 2013-10-23 DIAGNOSIS — J4489 Other specified chronic obstructive pulmonary disease: Secondary | ICD-10-CM | POA: Diagnosis not present

## 2013-10-23 DIAGNOSIS — R0989 Other specified symptoms and signs involving the circulatory and respiratory systems: Secondary | ICD-10-CM | POA: Diagnosis not present

## 2013-10-23 DIAGNOSIS — J449 Chronic obstructive pulmonary disease, unspecified: Secondary | ICD-10-CM | POA: Diagnosis not present

## 2013-11-01 DIAGNOSIS — F0390 Unspecified dementia without behavioral disturbance: Secondary | ICD-10-CM | POA: Diagnosis not present

## 2013-11-01 DIAGNOSIS — E119 Type 2 diabetes mellitus without complications: Secondary | ICD-10-CM | POA: Diagnosis not present

## 2013-11-01 DIAGNOSIS — J449 Chronic obstructive pulmonary disease, unspecified: Secondary | ICD-10-CM | POA: Diagnosis not present

## 2013-11-01 DIAGNOSIS — I951 Orthostatic hypotension: Secondary | ICD-10-CM | POA: Diagnosis not present

## 2013-11-01 DIAGNOSIS — D649 Anemia, unspecified: Secondary | ICD-10-CM | POA: Diagnosis not present

## 2013-11-01 DIAGNOSIS — Z794 Long term (current) use of insulin: Secondary | ICD-10-CM | POA: Diagnosis not present

## 2013-11-01 DIAGNOSIS — I69354 Hemiplegia and hemiparesis following cerebral infarction affecting left non-dominant side: Secondary | ICD-10-CM | POA: Diagnosis not present

## 2013-11-01 DIAGNOSIS — I10 Essential (primary) hypertension: Secondary | ICD-10-CM | POA: Diagnosis not present

## 2013-11-06 DIAGNOSIS — I69354 Hemiplegia and hemiparesis following cerebral infarction affecting left non-dominant side: Secondary | ICD-10-CM | POA: Diagnosis not present

## 2013-11-06 DIAGNOSIS — F0390 Unspecified dementia without behavioral disturbance: Secondary | ICD-10-CM | POA: Diagnosis not present

## 2013-11-06 DIAGNOSIS — I951 Orthostatic hypotension: Secondary | ICD-10-CM | POA: Diagnosis not present

## 2013-11-06 DIAGNOSIS — E119 Type 2 diabetes mellitus without complications: Secondary | ICD-10-CM | POA: Diagnosis not present

## 2013-11-06 DIAGNOSIS — I10 Essential (primary) hypertension: Secondary | ICD-10-CM | POA: Diagnosis not present

## 2013-11-06 DIAGNOSIS — J449 Chronic obstructive pulmonary disease, unspecified: Secondary | ICD-10-CM | POA: Diagnosis not present

## 2013-11-08 DIAGNOSIS — F0390 Unspecified dementia without behavioral disturbance: Secondary | ICD-10-CM | POA: Diagnosis not present

## 2013-11-08 DIAGNOSIS — I951 Orthostatic hypotension: Secondary | ICD-10-CM | POA: Diagnosis not present

## 2013-11-08 DIAGNOSIS — E119 Type 2 diabetes mellitus without complications: Secondary | ICD-10-CM | POA: Diagnosis not present

## 2013-11-08 DIAGNOSIS — I10 Essential (primary) hypertension: Secondary | ICD-10-CM | POA: Diagnosis not present

## 2013-11-08 DIAGNOSIS — J449 Chronic obstructive pulmonary disease, unspecified: Secondary | ICD-10-CM | POA: Diagnosis not present

## 2013-11-08 DIAGNOSIS — I69354 Hemiplegia and hemiparesis following cerebral infarction affecting left non-dominant side: Secondary | ICD-10-CM | POA: Diagnosis not present

## 2013-11-13 DIAGNOSIS — F0390 Unspecified dementia without behavioral disturbance: Secondary | ICD-10-CM | POA: Diagnosis not present

## 2013-11-13 DIAGNOSIS — I69354 Hemiplegia and hemiparesis following cerebral infarction affecting left non-dominant side: Secondary | ICD-10-CM | POA: Diagnosis not present

## 2013-11-13 DIAGNOSIS — J449 Chronic obstructive pulmonary disease, unspecified: Secondary | ICD-10-CM | POA: Diagnosis not present

## 2013-11-13 DIAGNOSIS — I951 Orthostatic hypotension: Secondary | ICD-10-CM | POA: Diagnosis not present

## 2013-11-13 DIAGNOSIS — E119 Type 2 diabetes mellitus without complications: Secondary | ICD-10-CM | POA: Diagnosis not present

## 2013-11-13 DIAGNOSIS — I10 Essential (primary) hypertension: Secondary | ICD-10-CM | POA: Diagnosis not present

## 2013-11-15 DIAGNOSIS — J449 Chronic obstructive pulmonary disease, unspecified: Secondary | ICD-10-CM | POA: Diagnosis not present

## 2013-11-15 DIAGNOSIS — I951 Orthostatic hypotension: Secondary | ICD-10-CM | POA: Diagnosis not present

## 2013-11-15 DIAGNOSIS — E119 Type 2 diabetes mellitus without complications: Secondary | ICD-10-CM | POA: Diagnosis not present

## 2013-11-15 DIAGNOSIS — I10 Essential (primary) hypertension: Secondary | ICD-10-CM | POA: Diagnosis not present

## 2013-11-15 DIAGNOSIS — I69354 Hemiplegia and hemiparesis following cerebral infarction affecting left non-dominant side: Secondary | ICD-10-CM | POA: Diagnosis not present

## 2013-11-15 DIAGNOSIS — F0390 Unspecified dementia without behavioral disturbance: Secondary | ICD-10-CM | POA: Diagnosis not present

## 2013-11-20 DIAGNOSIS — I951 Orthostatic hypotension: Secondary | ICD-10-CM | POA: Diagnosis not present

## 2013-11-20 DIAGNOSIS — I10 Essential (primary) hypertension: Secondary | ICD-10-CM | POA: Diagnosis not present

## 2013-11-20 DIAGNOSIS — F0390 Unspecified dementia without behavioral disturbance: Secondary | ICD-10-CM | POA: Diagnosis not present

## 2013-11-20 DIAGNOSIS — J449 Chronic obstructive pulmonary disease, unspecified: Secondary | ICD-10-CM | POA: Diagnosis not present

## 2013-11-20 DIAGNOSIS — I69354 Hemiplegia and hemiparesis following cerebral infarction affecting left non-dominant side: Secondary | ICD-10-CM | POA: Diagnosis not present

## 2013-11-20 DIAGNOSIS — E119 Type 2 diabetes mellitus without complications: Secondary | ICD-10-CM | POA: Diagnosis not present

## 2013-11-29 DIAGNOSIS — Z23 Encounter for immunization: Secondary | ICD-10-CM | POA: Diagnosis not present

## 2013-12-05 DIAGNOSIS — F0390 Unspecified dementia without behavioral disturbance: Secondary | ICD-10-CM | POA: Diagnosis not present

## 2013-12-05 DIAGNOSIS — E118 Type 2 diabetes mellitus with unspecified complications: Secondary | ICD-10-CM | POA: Diagnosis not present

## 2013-12-05 DIAGNOSIS — I10 Essential (primary) hypertension: Secondary | ICD-10-CM | POA: Diagnosis not present

## 2013-12-05 DIAGNOSIS — E119 Type 2 diabetes mellitus without complications: Secondary | ICD-10-CM | POA: Diagnosis not present

## 2013-12-05 DIAGNOSIS — I951 Orthostatic hypotension: Secondary | ICD-10-CM | POA: Diagnosis not present

## 2013-12-05 DIAGNOSIS — I69354 Hemiplegia and hemiparesis following cerebral infarction affecting left non-dominant side: Secondary | ICD-10-CM | POA: Diagnosis not present

## 2013-12-05 DIAGNOSIS — J449 Chronic obstructive pulmonary disease, unspecified: Secondary | ICD-10-CM | POA: Diagnosis not present

## 2013-12-27 DIAGNOSIS — F0390 Unspecified dementia without behavioral disturbance: Secondary | ICD-10-CM | POA: Diagnosis not present

## 2013-12-27 DIAGNOSIS — E119 Type 2 diabetes mellitus without complications: Secondary | ICD-10-CM | POA: Diagnosis not present

## 2013-12-27 DIAGNOSIS — I10 Essential (primary) hypertension: Secondary | ICD-10-CM | POA: Diagnosis not present

## 2013-12-27 DIAGNOSIS — I951 Orthostatic hypotension: Secondary | ICD-10-CM | POA: Diagnosis not present

## 2013-12-27 DIAGNOSIS — J449 Chronic obstructive pulmonary disease, unspecified: Secondary | ICD-10-CM | POA: Diagnosis not present

## 2013-12-27 DIAGNOSIS — I69354 Hemiplegia and hemiparesis following cerebral infarction affecting left non-dominant side: Secondary | ICD-10-CM | POA: Diagnosis not present

## 2014-01-02 DIAGNOSIS — E782 Mixed hyperlipidemia: Secondary | ICD-10-CM | POA: Diagnosis not present

## 2014-01-02 DIAGNOSIS — I10 Essential (primary) hypertension: Secondary | ICD-10-CM | POA: Diagnosis not present

## 2014-01-02 DIAGNOSIS — E1165 Type 2 diabetes mellitus with hyperglycemia: Secondary | ICD-10-CM | POA: Diagnosis not present

## 2014-01-10 DIAGNOSIS — E1142 Type 2 diabetes mellitus with diabetic polyneuropathy: Secondary | ICD-10-CM | POA: Diagnosis not present

## 2014-01-10 DIAGNOSIS — E782 Mixed hyperlipidemia: Secondary | ICD-10-CM | POA: Diagnosis not present

## 2014-01-10 DIAGNOSIS — E6609 Other obesity due to excess calories: Secondary | ICD-10-CM | POA: Diagnosis not present

## 2014-01-10 DIAGNOSIS — F17210 Nicotine dependence, cigarettes, uncomplicated: Secondary | ICD-10-CM | POA: Diagnosis not present

## 2014-01-10 DIAGNOSIS — G6289 Other specified polyneuropathies: Secondary | ICD-10-CM | POA: Diagnosis not present

## 2014-01-10 DIAGNOSIS — Z1389 Encounter for screening for other disorder: Secondary | ICD-10-CM | POA: Diagnosis not present

## 2014-01-10 DIAGNOSIS — G4733 Obstructive sleep apnea (adult) (pediatric): Secondary | ICD-10-CM | POA: Diagnosis not present

## 2014-01-10 DIAGNOSIS — I10 Essential (primary) hypertension: Secondary | ICD-10-CM | POA: Diagnosis not present

## 2014-02-11 DIAGNOSIS — I714 Abdominal aortic aneurysm, without rupture: Secondary | ICD-10-CM | POA: Diagnosis not present

## 2014-02-11 DIAGNOSIS — E039 Hypothyroidism, unspecified: Secondary | ICD-10-CM | POA: Diagnosis not present

## 2014-02-11 DIAGNOSIS — E6609 Other obesity due to excess calories: Secondary | ICD-10-CM | POA: Diagnosis not present

## 2014-02-11 DIAGNOSIS — G4733 Obstructive sleep apnea (adult) (pediatric): Secondary | ICD-10-CM | POA: Diagnosis not present

## 2014-02-11 DIAGNOSIS — E782 Mixed hyperlipidemia: Secondary | ICD-10-CM | POA: Diagnosis not present

## 2014-02-11 DIAGNOSIS — F331 Major depressive disorder, recurrent, moderate: Secondary | ICD-10-CM | POA: Diagnosis not present

## 2014-02-11 DIAGNOSIS — E1142 Type 2 diabetes mellitus with diabetic polyneuropathy: Secondary | ICD-10-CM | POA: Diagnosis not present

## 2014-02-11 DIAGNOSIS — I10 Essential (primary) hypertension: Secondary | ICD-10-CM | POA: Diagnosis not present

## 2014-02-11 DIAGNOSIS — G43909 Migraine, unspecified, not intractable, without status migrainosus: Secondary | ICD-10-CM | POA: Diagnosis not present

## 2014-02-11 DIAGNOSIS — G8194 Hemiplegia, unspecified affecting left nondominant side: Secondary | ICD-10-CM | POA: Diagnosis not present

## 2014-02-11 DIAGNOSIS — G6289 Other specified polyneuropathies: Secondary | ICD-10-CM | POA: Diagnosis not present

## 2014-02-11 DIAGNOSIS — F17210 Nicotine dependence, cigarettes, uncomplicated: Secondary | ICD-10-CM | POA: Diagnosis not present

## 2014-05-26 DIAGNOSIS — K219 Gastro-esophageal reflux disease without esophagitis: Secondary | ICD-10-CM | POA: Diagnosis not present

## 2014-05-26 DIAGNOSIS — E780 Pure hypercholesterolemia: Secondary | ICD-10-CM | POA: Diagnosis not present

## 2014-05-26 DIAGNOSIS — S2020XA Contusion of thorax, unspecified, initial encounter: Secondary | ICD-10-CM | POA: Diagnosis not present

## 2014-05-26 DIAGNOSIS — E119 Type 2 diabetes mellitus without complications: Secondary | ICD-10-CM | POA: Diagnosis not present

## 2014-05-26 DIAGNOSIS — R0681 Apnea, not elsewhere classified: Secondary | ICD-10-CM | POA: Diagnosis not present

## 2014-05-26 DIAGNOSIS — S20212A Contusion of left front wall of thorax, initial encounter: Secondary | ICD-10-CM | POA: Diagnosis not present

## 2014-05-26 DIAGNOSIS — E039 Hypothyroidism, unspecified: Secondary | ICD-10-CM | POA: Diagnosis not present

## 2014-05-26 DIAGNOSIS — R55 Syncope and collapse: Secondary | ICD-10-CM | POA: Diagnosis not present

## 2014-05-26 DIAGNOSIS — S0990XA Unspecified injury of head, initial encounter: Secondary | ICD-10-CM | POA: Diagnosis not present

## 2014-05-26 DIAGNOSIS — Z794 Long term (current) use of insulin: Secondary | ICD-10-CM | POA: Diagnosis not present

## 2014-05-26 DIAGNOSIS — W19XXXA Unspecified fall, initial encounter: Secondary | ICD-10-CM | POA: Diagnosis not present

## 2014-05-26 DIAGNOSIS — J449 Chronic obstructive pulmonary disease, unspecified: Secondary | ICD-10-CM | POA: Diagnosis not present

## 2014-05-26 DIAGNOSIS — E1140 Type 2 diabetes mellitus with diabetic neuropathy, unspecified: Secondary | ICD-10-CM | POA: Diagnosis not present

## 2014-05-26 DIAGNOSIS — R0602 Shortness of breath: Secondary | ICD-10-CM | POA: Diagnosis not present

## 2014-05-26 DIAGNOSIS — R252 Cramp and spasm: Secondary | ICD-10-CM | POA: Diagnosis not present

## 2014-05-26 DIAGNOSIS — F329 Major depressive disorder, single episode, unspecified: Secondary | ICD-10-CM | POA: Diagnosis not present

## 2014-05-26 DIAGNOSIS — I10 Essential (primary) hypertension: Secondary | ICD-10-CM | POA: Diagnosis not present

## 2014-05-26 DIAGNOSIS — Z79899 Other long term (current) drug therapy: Secondary | ICD-10-CM | POA: Diagnosis not present

## 2014-06-07 DIAGNOSIS — E782 Mixed hyperlipidemia: Secondary | ICD-10-CM | POA: Diagnosis not present

## 2014-06-07 DIAGNOSIS — K210 Gastro-esophageal reflux disease with esophagitis: Secondary | ICD-10-CM | POA: Diagnosis not present

## 2014-06-07 DIAGNOSIS — E1142 Type 2 diabetes mellitus with diabetic polyneuropathy: Secondary | ICD-10-CM | POA: Diagnosis not present

## 2014-06-07 DIAGNOSIS — E039 Hypothyroidism, unspecified: Secondary | ICD-10-CM | POA: Diagnosis not present

## 2014-06-07 DIAGNOSIS — E6609 Other obesity due to excess calories: Secondary | ICD-10-CM | POA: Diagnosis not present

## 2014-06-07 DIAGNOSIS — I10 Essential (primary) hypertension: Secondary | ICD-10-CM | POA: Diagnosis not present

## 2014-06-14 DIAGNOSIS — E039 Hypothyroidism, unspecified: Secondary | ICD-10-CM | POA: Diagnosis not present

## 2014-06-14 DIAGNOSIS — E782 Mixed hyperlipidemia: Secondary | ICD-10-CM | POA: Diagnosis not present

## 2014-06-14 DIAGNOSIS — G43909 Migraine, unspecified, not intractable, without status migrainosus: Secondary | ICD-10-CM | POA: Diagnosis not present

## 2014-06-14 DIAGNOSIS — F17210 Nicotine dependence, cigarettes, uncomplicated: Secondary | ICD-10-CM | POA: Diagnosis not present

## 2014-06-14 DIAGNOSIS — G4733 Obstructive sleep apnea (adult) (pediatric): Secondary | ICD-10-CM | POA: Diagnosis not present

## 2014-06-14 DIAGNOSIS — G6289 Other specified polyneuropathies: Secondary | ICD-10-CM | POA: Diagnosis not present

## 2014-06-14 DIAGNOSIS — F331 Major depressive disorder, recurrent, moderate: Secondary | ICD-10-CM | POA: Diagnosis not present

## 2014-06-14 DIAGNOSIS — E6609 Other obesity due to excess calories: Secondary | ICD-10-CM | POA: Diagnosis not present

## 2014-06-14 DIAGNOSIS — G8194 Hemiplegia, unspecified affecting left nondominant side: Secondary | ICD-10-CM | POA: Diagnosis not present

## 2014-06-14 DIAGNOSIS — I714 Abdominal aortic aneurysm, without rupture: Secondary | ICD-10-CM | POA: Diagnosis not present

## 2014-06-14 DIAGNOSIS — E1142 Type 2 diabetes mellitus with diabetic polyneuropathy: Secondary | ICD-10-CM | POA: Diagnosis not present

## 2014-06-14 DIAGNOSIS — I10 Essential (primary) hypertension: Secondary | ICD-10-CM | POA: Diagnosis not present

## 2014-10-23 DIAGNOSIS — I10 Essential (primary) hypertension: Secondary | ICD-10-CM | POA: Diagnosis not present

## 2014-10-23 DIAGNOSIS — E1142 Type 2 diabetes mellitus with diabetic polyneuropathy: Secondary | ICD-10-CM | POA: Diagnosis not present

## 2014-10-23 DIAGNOSIS — K210 Gastro-esophageal reflux disease with esophagitis: Secondary | ICD-10-CM | POA: Diagnosis not present

## 2014-10-23 DIAGNOSIS — E782 Mixed hyperlipidemia: Secondary | ICD-10-CM | POA: Diagnosis not present

## 2014-10-23 DIAGNOSIS — E039 Hypothyroidism, unspecified: Secondary | ICD-10-CM | POA: Diagnosis not present

## 2014-10-30 DIAGNOSIS — E1142 Type 2 diabetes mellitus with diabetic polyneuropathy: Secondary | ICD-10-CM | POA: Diagnosis not present

## 2014-10-30 DIAGNOSIS — Z23 Encounter for immunization: Secondary | ICD-10-CM | POA: Diagnosis not present

## 2014-10-30 DIAGNOSIS — G43909 Migraine, unspecified, not intractable, without status migrainosus: Secondary | ICD-10-CM | POA: Diagnosis not present

## 2014-10-30 DIAGNOSIS — F17210 Nicotine dependence, cigarettes, uncomplicated: Secondary | ICD-10-CM | POA: Diagnosis not present

## 2014-10-30 DIAGNOSIS — E6609 Other obesity due to excess calories: Secondary | ICD-10-CM | POA: Diagnosis not present

## 2014-10-30 DIAGNOSIS — I10 Essential (primary) hypertension: Secondary | ICD-10-CM | POA: Diagnosis not present

## 2014-10-30 DIAGNOSIS — G6289 Other specified polyneuropathies: Secondary | ICD-10-CM | POA: Diagnosis not present

## 2014-10-30 DIAGNOSIS — I739 Peripheral vascular disease, unspecified: Secondary | ICD-10-CM | POA: Diagnosis not present

## 2014-10-30 DIAGNOSIS — G4733 Obstructive sleep apnea (adult) (pediatric): Secondary | ICD-10-CM | POA: Diagnosis not present

## 2014-10-30 DIAGNOSIS — G8194 Hemiplegia, unspecified affecting left nondominant side: Secondary | ICD-10-CM | POA: Diagnosis not present

## 2014-10-30 DIAGNOSIS — F331 Major depressive disorder, recurrent, moderate: Secondary | ICD-10-CM | POA: Diagnosis not present

## 2014-10-30 DIAGNOSIS — I714 Abdominal aortic aneurysm, without rupture: Secondary | ICD-10-CM | POA: Diagnosis not present

## 2015-02-28 DIAGNOSIS — L03113 Cellulitis of right upper limb: Secondary | ICD-10-CM | POA: Diagnosis not present

## 2015-02-28 DIAGNOSIS — H6691 Otitis media, unspecified, right ear: Secondary | ICD-10-CM | POA: Diagnosis not present

## 2015-02-28 DIAGNOSIS — I10 Essential (primary) hypertension: Secondary | ICD-10-CM | POA: Diagnosis not present

## 2015-02-28 DIAGNOSIS — J449 Chronic obstructive pulmonary disease, unspecified: Secondary | ICD-10-CM | POA: Diagnosis not present

## 2015-02-28 DIAGNOSIS — K210 Gastro-esophageal reflux disease with esophagitis: Secondary | ICD-10-CM | POA: Diagnosis not present

## 2015-02-28 DIAGNOSIS — E1122 Type 2 diabetes mellitus with diabetic chronic kidney disease: Secondary | ICD-10-CM | POA: Diagnosis not present

## 2015-02-28 DIAGNOSIS — E1142 Type 2 diabetes mellitus with diabetic polyneuropathy: Secondary | ICD-10-CM | POA: Diagnosis not present

## 2015-02-28 DIAGNOSIS — E782 Mixed hyperlipidemia: Secondary | ICD-10-CM | POA: Diagnosis not present

## 2015-02-28 DIAGNOSIS — S40811A Abrasion of right upper arm, initial encounter: Secondary | ICD-10-CM | POA: Diagnosis not present

## 2015-03-03 DIAGNOSIS — E782 Mixed hyperlipidemia: Secondary | ICD-10-CM | POA: Diagnosis not present

## 2015-03-03 DIAGNOSIS — E1122 Type 2 diabetes mellitus with diabetic chronic kidney disease: Secondary | ICD-10-CM | POA: Diagnosis not present

## 2015-03-03 DIAGNOSIS — E1142 Type 2 diabetes mellitus with diabetic polyneuropathy: Secondary | ICD-10-CM | POA: Diagnosis not present

## 2015-03-03 DIAGNOSIS — J449 Chronic obstructive pulmonary disease, unspecified: Secondary | ICD-10-CM | POA: Diagnosis not present

## 2015-03-03 DIAGNOSIS — K210 Gastro-esophageal reflux disease with esophagitis: Secondary | ICD-10-CM | POA: Diagnosis not present

## 2015-03-03 DIAGNOSIS — I10 Essential (primary) hypertension: Secondary | ICD-10-CM | POA: Diagnosis not present

## 2015-03-07 DIAGNOSIS — F331 Major depressive disorder, recurrent, moderate: Secondary | ICD-10-CM | POA: Diagnosis not present

## 2015-03-07 DIAGNOSIS — I10 Essential (primary) hypertension: Secondary | ICD-10-CM | POA: Diagnosis not present

## 2015-03-07 DIAGNOSIS — E1122 Type 2 diabetes mellitus with diabetic chronic kidney disease: Secondary | ICD-10-CM | POA: Diagnosis not present

## 2015-03-07 DIAGNOSIS — G4733 Obstructive sleep apnea (adult) (pediatric): Secondary | ICD-10-CM | POA: Diagnosis not present

## 2015-03-07 DIAGNOSIS — E782 Mixed hyperlipidemia: Secondary | ICD-10-CM | POA: Diagnosis not present

## 2015-03-07 DIAGNOSIS — G43909 Migraine, unspecified, not intractable, without status migrainosus: Secondary | ICD-10-CM | POA: Diagnosis not present

## 2015-03-07 DIAGNOSIS — E6609 Other obesity due to excess calories: Secondary | ICD-10-CM | POA: Diagnosis not present

## 2015-03-07 DIAGNOSIS — I714 Abdominal aortic aneurysm, without rupture: Secondary | ICD-10-CM | POA: Diagnosis not present

## 2015-03-07 DIAGNOSIS — G6289 Other specified polyneuropathies: Secondary | ICD-10-CM | POA: Diagnosis not present

## 2015-03-07 DIAGNOSIS — F17210 Nicotine dependence, cigarettes, uncomplicated: Secondary | ICD-10-CM | POA: Diagnosis not present

## 2015-03-07 DIAGNOSIS — G8194 Hemiplegia, unspecified affecting left nondominant side: Secondary | ICD-10-CM | POA: Diagnosis not present

## 2015-03-07 DIAGNOSIS — E1142 Type 2 diabetes mellitus with diabetic polyneuropathy: Secondary | ICD-10-CM | POA: Diagnosis not present

## 2015-03-12 DIAGNOSIS — R42 Dizziness and giddiness: Secondary | ICD-10-CM | POA: Diagnosis not present

## 2015-03-12 DIAGNOSIS — I639 Cerebral infarction, unspecified: Secondary | ICD-10-CM | POA: Diagnosis not present

## 2015-03-12 DIAGNOSIS — K210 Gastro-esophageal reflux disease with esophagitis: Secondary | ICD-10-CM | POA: Diagnosis not present

## 2015-03-12 DIAGNOSIS — H532 Diplopia: Secondary | ICD-10-CM | POA: Diagnosis not present

## 2015-03-12 DIAGNOSIS — R270 Ataxia, unspecified: Secondary | ICD-10-CM | POA: Diagnosis not present

## 2015-03-12 DIAGNOSIS — E1165 Type 2 diabetes mellitus with hyperglycemia: Secondary | ICD-10-CM | POA: Diagnosis not present

## 2015-03-19 DIAGNOSIS — J449 Chronic obstructive pulmonary disease, unspecified: Secondary | ICD-10-CM | POA: Diagnosis not present

## 2015-03-19 DIAGNOSIS — E1142 Type 2 diabetes mellitus with diabetic polyneuropathy: Secondary | ICD-10-CM | POA: Diagnosis not present

## 2015-04-04 DIAGNOSIS — E876 Hypokalemia: Secondary | ICD-10-CM | POA: Diagnosis not present

## 2015-06-12 DIAGNOSIS — H2513 Age-related nuclear cataract, bilateral: Secondary | ICD-10-CM | POA: Diagnosis not present

## 2015-06-12 DIAGNOSIS — E11311 Type 2 diabetes mellitus with unspecified diabetic retinopathy with macular edema: Secondary | ICD-10-CM | POA: Diagnosis not present

## 2015-06-12 DIAGNOSIS — Z7984 Long term (current) use of oral hypoglycemic drugs: Secondary | ICD-10-CM | POA: Diagnosis not present

## 2015-06-12 DIAGNOSIS — E113213 Type 2 diabetes mellitus with mild nonproliferative diabetic retinopathy with macular edema, bilateral: Secondary | ICD-10-CM | POA: Diagnosis not present

## 2015-06-30 DIAGNOSIS — E782 Mixed hyperlipidemia: Secondary | ICD-10-CM | POA: Diagnosis not present

## 2015-06-30 DIAGNOSIS — E876 Hypokalemia: Secondary | ICD-10-CM | POA: Diagnosis not present

## 2015-06-30 DIAGNOSIS — K210 Gastro-esophageal reflux disease with esophagitis: Secondary | ICD-10-CM | POA: Diagnosis not present

## 2015-06-30 DIAGNOSIS — E1122 Type 2 diabetes mellitus with diabetic chronic kidney disease: Secondary | ICD-10-CM | POA: Diagnosis not present

## 2015-06-30 DIAGNOSIS — E1142 Type 2 diabetes mellitus with diabetic polyneuropathy: Secondary | ICD-10-CM | POA: Diagnosis not present

## 2015-07-02 DIAGNOSIS — E1142 Type 2 diabetes mellitus with diabetic polyneuropathy: Secondary | ICD-10-CM | POA: Diagnosis not present

## 2015-07-02 DIAGNOSIS — G4733 Obstructive sleep apnea (adult) (pediatric): Secondary | ICD-10-CM | POA: Diagnosis not present

## 2015-07-02 DIAGNOSIS — E6609 Other obesity due to excess calories: Secondary | ICD-10-CM | POA: Diagnosis not present

## 2015-07-02 DIAGNOSIS — F17210 Nicotine dependence, cigarettes, uncomplicated: Secondary | ICD-10-CM | POA: Diagnosis not present

## 2015-07-02 DIAGNOSIS — G8194 Hemiplegia, unspecified affecting left nondominant side: Secondary | ICD-10-CM | POA: Diagnosis not present

## 2015-07-02 DIAGNOSIS — F331 Major depressive disorder, recurrent, moderate: Secondary | ICD-10-CM | POA: Diagnosis not present

## 2015-07-02 DIAGNOSIS — E1122 Type 2 diabetes mellitus with diabetic chronic kidney disease: Secondary | ICD-10-CM | POA: Diagnosis not present

## 2015-07-02 DIAGNOSIS — E782 Mixed hyperlipidemia: Secondary | ICD-10-CM | POA: Diagnosis not present

## 2015-07-11 DIAGNOSIS — R197 Diarrhea, unspecified: Secondary | ICD-10-CM | POA: Diagnosis not present

## 2015-08-10 DIAGNOSIS — J441 Chronic obstructive pulmonary disease with (acute) exacerbation: Secondary | ICD-10-CM | POA: Diagnosis not present

## 2015-08-10 DIAGNOSIS — S2241XA Multiple fractures of ribs, right side, initial encounter for closed fracture: Secondary | ICD-10-CM | POA: Diagnosis not present

## 2015-08-10 DIAGNOSIS — N19 Unspecified kidney failure: Secondary | ICD-10-CM | POA: Diagnosis not present

## 2015-08-10 DIAGNOSIS — I5032 Chronic diastolic (congestive) heart failure: Secondary | ICD-10-CM | POA: Diagnosis not present

## 2015-08-10 DIAGNOSIS — J9811 Atelectasis: Secondary | ICD-10-CM | POA: Diagnosis not present

## 2015-08-10 DIAGNOSIS — S0990XA Unspecified injury of head, initial encounter: Secondary | ICD-10-CM | POA: Diagnosis not present

## 2015-08-10 DIAGNOSIS — J9621 Acute and chronic respiratory failure with hypoxia: Secondary | ICD-10-CM | POA: Diagnosis not present

## 2015-08-10 DIAGNOSIS — S199XXA Unspecified injury of neck, initial encounter: Secondary | ICD-10-CM | POA: Diagnosis not present

## 2015-08-10 DIAGNOSIS — S3993XA Unspecified injury of pelvis, initial encounter: Secondary | ICD-10-CM | POA: Diagnosis not present

## 2015-08-10 DIAGNOSIS — M25562 Pain in left knee: Secondary | ICD-10-CM | POA: Diagnosis not present

## 2015-08-10 DIAGNOSIS — S3991XA Unspecified injury of abdomen, initial encounter: Secondary | ICD-10-CM | POA: Diagnosis not present

## 2015-08-10 DIAGNOSIS — E871 Hypo-osmolality and hyponatremia: Secondary | ICD-10-CM | POA: Diagnosis not present

## 2015-08-10 DIAGNOSIS — Z9981 Dependence on supplemental oxygen: Secondary | ICD-10-CM | POA: Diagnosis not present

## 2015-08-10 DIAGNOSIS — N170 Acute kidney failure with tubular necrosis: Secondary | ICD-10-CM | POA: Diagnosis not present

## 2015-08-11 DIAGNOSIS — J9621 Acute and chronic respiratory failure with hypoxia: Secondary | ICD-10-CM | POA: Diagnosis present

## 2015-08-11 DIAGNOSIS — M25562 Pain in left knee: Secondary | ICD-10-CM | POA: Diagnosis not present

## 2015-08-11 DIAGNOSIS — F17210 Nicotine dependence, cigarettes, uncomplicated: Secondary | ICD-10-CM | POA: Diagnosis present

## 2015-08-11 DIAGNOSIS — I714 Abdominal aortic aneurysm, without rupture: Secondary | ICD-10-CM | POA: Diagnosis present

## 2015-08-11 DIAGNOSIS — I5032 Chronic diastolic (congestive) heart failure: Secondary | ICD-10-CM | POA: Diagnosis present

## 2015-08-11 DIAGNOSIS — N400 Enlarged prostate without lower urinary tract symptoms: Secondary | ICD-10-CM | POA: Diagnosis present

## 2015-08-11 DIAGNOSIS — F5104 Psychophysiologic insomnia: Secondary | ICD-10-CM | POA: Diagnosis present

## 2015-08-11 DIAGNOSIS — J9601 Acute respiratory failure with hypoxia: Secondary | ICD-10-CM | POA: Diagnosis not present

## 2015-08-11 DIAGNOSIS — J441 Chronic obstructive pulmonary disease with (acute) exacerbation: Secondary | ICD-10-CM | POA: Diagnosis present

## 2015-08-11 DIAGNOSIS — J9611 Chronic respiratory failure with hypoxia: Secondary | ICD-10-CM | POA: Diagnosis not present

## 2015-08-11 DIAGNOSIS — T464X5A Adverse effect of angiotensin-converting-enzyme inhibitors, initial encounter: Secondary | ICD-10-CM | POA: Diagnosis present

## 2015-08-11 DIAGNOSIS — I10 Essential (primary) hypertension: Secondary | ICD-10-CM | POA: Diagnosis not present

## 2015-08-11 DIAGNOSIS — E875 Hyperkalemia: Secondary | ICD-10-CM | POA: Diagnosis present

## 2015-08-11 DIAGNOSIS — E039 Hypothyroidism, unspecified: Secondary | ICD-10-CM | POA: Diagnosis present

## 2015-08-11 DIAGNOSIS — R911 Solitary pulmonary nodule: Secondary | ICD-10-CM | POA: Diagnosis present

## 2015-08-11 DIAGNOSIS — N170 Acute kidney failure with tubular necrosis: Secondary | ICD-10-CM | POA: Diagnosis present

## 2015-08-11 DIAGNOSIS — F419 Anxiety disorder, unspecified: Secondary | ICD-10-CM | POA: Diagnosis present

## 2015-08-11 DIAGNOSIS — E1142 Type 2 diabetes mellitus with diabetic polyneuropathy: Secondary | ICD-10-CM | POA: Diagnosis present

## 2015-08-11 DIAGNOSIS — R262 Difficulty in walking, not elsewhere classified: Secondary | ICD-10-CM | POA: Diagnosis not present

## 2015-08-11 DIAGNOSIS — E119 Type 2 diabetes mellitus without complications: Secondary | ICD-10-CM | POA: Diagnosis not present

## 2015-08-11 DIAGNOSIS — M6281 Muscle weakness (generalized): Secondary | ICD-10-CM | POA: Diagnosis not present

## 2015-08-11 DIAGNOSIS — K219 Gastro-esophageal reflux disease without esophagitis: Secondary | ICD-10-CM | POA: Diagnosis present

## 2015-08-11 DIAGNOSIS — E876 Hypokalemia: Secondary | ICD-10-CM | POA: Diagnosis not present

## 2015-08-11 DIAGNOSIS — J449 Chronic obstructive pulmonary disease, unspecified: Secondary | ICD-10-CM | POA: Diagnosis not present

## 2015-08-11 DIAGNOSIS — Z9181 History of falling: Secondary | ICD-10-CM | POA: Diagnosis not present

## 2015-08-11 DIAGNOSIS — I110 Hypertensive heart disease with heart failure: Secondary | ICD-10-CM | POA: Diagnosis present

## 2015-08-11 DIAGNOSIS — N179 Acute kidney failure, unspecified: Secondary | ICD-10-CM | POA: Diagnosis not present

## 2015-08-11 DIAGNOSIS — Z9981 Dependence on supplemental oxygen: Secondary | ICD-10-CM | POA: Diagnosis not present

## 2015-08-11 DIAGNOSIS — E785 Hyperlipidemia, unspecified: Secondary | ICD-10-CM | POA: Diagnosis present

## 2015-08-11 DIAGNOSIS — E871 Hypo-osmolality and hyponatremia: Secondary | ICD-10-CM | POA: Diagnosis not present

## 2015-08-11 DIAGNOSIS — Z6834 Body mass index (BMI) 34.0-34.9, adult: Secondary | ICD-10-CM | POA: Diagnosis not present

## 2015-08-11 DIAGNOSIS — I69393 Ataxia following cerebral infarction: Secondary | ICD-10-CM | POA: Diagnosis not present

## 2015-08-14 DIAGNOSIS — J156 Pneumonia due to other aerobic Gram-negative bacteria: Secondary | ICD-10-CM | POA: Diagnosis not present

## 2015-08-14 DIAGNOSIS — K219 Gastro-esophageal reflux disease without esophagitis: Secondary | ICD-10-CM | POA: Diagnosis not present

## 2015-08-14 DIAGNOSIS — N400 Enlarged prostate without lower urinary tract symptoms: Secondary | ICD-10-CM | POA: Diagnosis not present

## 2015-08-14 DIAGNOSIS — R0902 Hypoxemia: Secondary | ICD-10-CM | POA: Diagnosis not present

## 2015-08-14 DIAGNOSIS — Z87891 Personal history of nicotine dependence: Secondary | ICD-10-CM | POA: Diagnosis not present

## 2015-08-14 DIAGNOSIS — E039 Hypothyroidism, unspecified: Secondary | ICD-10-CM | POA: Diagnosis not present

## 2015-08-14 DIAGNOSIS — J449 Chronic obstructive pulmonary disease, unspecified: Secondary | ICD-10-CM | POA: Diagnosis not present

## 2015-08-14 DIAGNOSIS — I10 Essential (primary) hypertension: Secondary | ICD-10-CM | POA: Diagnosis not present

## 2015-08-14 DIAGNOSIS — Z9181 History of falling: Secondary | ICD-10-CM | POA: Diagnosis not present

## 2015-08-14 DIAGNOSIS — R918 Other nonspecific abnormal finding of lung field: Secondary | ICD-10-CM | POA: Diagnosis not present

## 2015-08-14 DIAGNOSIS — F329 Major depressive disorder, single episode, unspecified: Secondary | ICD-10-CM | POA: Diagnosis not present

## 2015-08-14 DIAGNOSIS — Z7951 Long term (current) use of inhaled steroids: Secondary | ICD-10-CM | POA: Diagnosis not present

## 2015-08-14 DIAGNOSIS — E871 Hypo-osmolality and hyponatremia: Secondary | ICD-10-CM | POA: Diagnosis not present

## 2015-08-14 DIAGNOSIS — E7800 Pure hypercholesterolemia, unspecified: Secondary | ICD-10-CM | POA: Diagnosis not present

## 2015-08-14 DIAGNOSIS — M6281 Muscle weakness (generalized): Secondary | ICD-10-CM | POA: Diagnosis not present

## 2015-08-14 DIAGNOSIS — E785 Hyperlipidemia, unspecified: Secondary | ICD-10-CM | POA: Diagnosis not present

## 2015-08-14 DIAGNOSIS — Z7902 Long term (current) use of antithrombotics/antiplatelets: Secondary | ICD-10-CM | POA: Diagnosis not present

## 2015-08-14 DIAGNOSIS — I69393 Ataxia following cerebral infarction: Secondary | ICD-10-CM | POA: Diagnosis not present

## 2015-08-14 DIAGNOSIS — Z7401 Bed confinement status: Secondary | ICD-10-CM | POA: Diagnosis not present

## 2015-08-14 DIAGNOSIS — F17210 Nicotine dependence, cigarettes, uncomplicated: Secondary | ICD-10-CM | POA: Diagnosis not present

## 2015-08-14 DIAGNOSIS — Z9981 Dependence on supplemental oxygen: Secondary | ICD-10-CM | POA: Diagnosis not present

## 2015-08-14 DIAGNOSIS — E119 Type 2 diabetes mellitus without complications: Secondary | ICD-10-CM | POA: Diagnosis not present

## 2015-08-14 DIAGNOSIS — F5104 Psychophysiologic insomnia: Secondary | ICD-10-CM | POA: Diagnosis not present

## 2015-08-14 DIAGNOSIS — E1140 Type 2 diabetes mellitus with diabetic neuropathy, unspecified: Secondary | ICD-10-CM | POA: Diagnosis not present

## 2015-08-14 DIAGNOSIS — J9611 Chronic respiratory failure with hypoxia: Secondary | ICD-10-CM | POA: Diagnosis not present

## 2015-08-14 DIAGNOSIS — Z79899 Other long term (current) drug therapy: Secondary | ICD-10-CM | POA: Diagnosis not present

## 2015-08-14 DIAGNOSIS — J441 Chronic obstructive pulmonary disease with (acute) exacerbation: Secondary | ICD-10-CM | POA: Diagnosis not present

## 2015-08-14 DIAGNOSIS — E876 Hypokalemia: Secondary | ICD-10-CM | POA: Diagnosis not present

## 2015-08-14 DIAGNOSIS — J9601 Acute respiratory failure with hypoxia: Secondary | ICD-10-CM | POA: Diagnosis not present

## 2015-08-14 DIAGNOSIS — R262 Difficulty in walking, not elsewhere classified: Secondary | ICD-10-CM | POA: Diagnosis not present

## 2015-08-14 DIAGNOSIS — N179 Acute kidney failure, unspecified: Secondary | ICD-10-CM | POA: Diagnosis not present

## 2015-08-14 DIAGNOSIS — R0602 Shortness of breath: Secondary | ICD-10-CM | POA: Diagnosis not present

## 2015-08-14 DIAGNOSIS — J440 Chronic obstructive pulmonary disease with acute lower respiratory infection: Secondary | ICD-10-CM | POA: Diagnosis not present

## 2015-08-19 DIAGNOSIS — J440 Chronic obstructive pulmonary disease with acute lower respiratory infection: Secondary | ICD-10-CM | POA: Diagnosis not present

## 2015-08-19 DIAGNOSIS — J9601 Acute respiratory failure with hypoxia: Secondary | ICD-10-CM | POA: Diagnosis not present

## 2015-08-19 DIAGNOSIS — E871 Hypo-osmolality and hyponatremia: Secondary | ICD-10-CM | POA: Diagnosis not present

## 2015-08-29 DIAGNOSIS — J440 Chronic obstructive pulmonary disease with acute lower respiratory infection: Secondary | ICD-10-CM | POA: Diagnosis not present

## 2015-08-29 DIAGNOSIS — E039 Hypothyroidism, unspecified: Secondary | ICD-10-CM | POA: Diagnosis not present

## 2015-08-29 DIAGNOSIS — J156 Pneumonia due to other aerobic Gram-negative bacteria: Secondary | ICD-10-CM | POA: Diagnosis not present

## 2015-08-29 DIAGNOSIS — E7800 Pure hypercholesterolemia, unspecified: Secondary | ICD-10-CM | POA: Diagnosis not present

## 2015-08-29 DIAGNOSIS — J441 Chronic obstructive pulmonary disease with (acute) exacerbation: Secondary | ICD-10-CM | POA: Diagnosis not present

## 2015-08-29 DIAGNOSIS — I10 Essential (primary) hypertension: Secondary | ICD-10-CM | POA: Diagnosis not present

## 2015-08-29 DIAGNOSIS — R918 Other nonspecific abnormal finding of lung field: Secondary | ICD-10-CM | POA: Diagnosis not present

## 2015-10-01 DIAGNOSIS — Z9981 Dependence on supplemental oxygen: Secondary | ICD-10-CM | POA: Diagnosis not present

## 2015-10-01 DIAGNOSIS — J9611 Chronic respiratory failure with hypoxia: Secondary | ICD-10-CM | POA: Diagnosis not present

## 2015-10-01 DIAGNOSIS — S20219A Contusion of unspecified front wall of thorax, initial encounter: Secondary | ICD-10-CM | POA: Diagnosis not present

## 2015-10-01 DIAGNOSIS — W19XXXA Unspecified fall, initial encounter: Secondary | ICD-10-CM | POA: Diagnosis not present

## 2015-10-01 DIAGNOSIS — I129 Hypertensive chronic kidney disease with stage 1 through stage 4 chronic kidney disease, or unspecified chronic kidney disease: Secondary | ICD-10-CM | POA: Diagnosis present

## 2015-10-01 DIAGNOSIS — N189 Chronic kidney disease, unspecified: Secondary | ICD-10-CM | POA: Diagnosis not present

## 2015-10-01 DIAGNOSIS — E7800 Pure hypercholesterolemia, unspecified: Secondary | ICD-10-CM | POA: Diagnosis present

## 2015-10-01 DIAGNOSIS — E872 Acidosis: Secondary | ICD-10-CM | POA: Diagnosis not present

## 2015-10-01 DIAGNOSIS — Z7951 Long term (current) use of inhaled steroids: Secondary | ICD-10-CM | POA: Diagnosis not present

## 2015-10-01 DIAGNOSIS — F329 Major depressive disorder, single episode, unspecified: Secondary | ICD-10-CM | POA: Diagnosis not present

## 2015-10-01 DIAGNOSIS — R7989 Other specified abnormal findings of blood chemistry: Secondary | ICD-10-CM | POA: Diagnosis not present

## 2015-10-01 DIAGNOSIS — F17200 Nicotine dependence, unspecified, uncomplicated: Secondary | ICD-10-CM | POA: Diagnosis not present

## 2015-10-01 DIAGNOSIS — E878 Other disorders of electrolyte and fluid balance, not elsewhere classified: Secondary | ICD-10-CM | POA: Diagnosis not present

## 2015-10-01 DIAGNOSIS — S82852D Displaced trimalleolar fracture of left lower leg, subsequent encounter for closed fracture with routine healing: Secondary | ICD-10-CM | POA: Diagnosis not present

## 2015-10-01 DIAGNOSIS — K219 Gastro-esophageal reflux disease without esophagitis: Secondary | ICD-10-CM | POA: Diagnosis not present

## 2015-10-01 DIAGNOSIS — E785 Hyperlipidemia, unspecified: Secondary | ICD-10-CM | POA: Diagnosis present

## 2015-10-01 DIAGNOSIS — E1122 Type 2 diabetes mellitus with diabetic chronic kidney disease: Secondary | ICD-10-CM | POA: Diagnosis not present

## 2015-10-01 DIAGNOSIS — G4733 Obstructive sleep apnea (adult) (pediatric): Secondary | ICD-10-CM | POA: Diagnosis present

## 2015-10-01 DIAGNOSIS — J449 Chronic obstructive pulmonary disease, unspecified: Secondary | ICD-10-CM | POA: Diagnosis not present

## 2015-10-01 DIAGNOSIS — X58XXXD Exposure to other specified factors, subsequent encounter: Secondary | ICD-10-CM | POA: Diagnosis not present

## 2015-10-01 DIAGNOSIS — R2689 Other abnormalities of gait and mobility: Secondary | ICD-10-CM | POA: Diagnosis not present

## 2015-10-01 DIAGNOSIS — N281 Cyst of kidney, acquired: Secondary | ICD-10-CM | POA: Diagnosis not present

## 2015-10-01 DIAGNOSIS — M25572 Pain in left ankle and joints of left foot: Secondary | ICD-10-CM | POA: Diagnosis not present

## 2015-10-01 DIAGNOSIS — W1839XA Other fall on same level, initial encounter: Secondary | ICD-10-CM | POA: Diagnosis not present

## 2015-10-01 DIAGNOSIS — E11319 Type 2 diabetes mellitus with unspecified diabetic retinopathy without macular edema: Secondary | ICD-10-CM | POA: Diagnosis not present

## 2015-10-01 DIAGNOSIS — S82852A Displaced trimalleolar fracture of left lower leg, initial encounter for closed fracture: Secondary | ICD-10-CM | POA: Diagnosis not present

## 2015-10-01 DIAGNOSIS — E871 Hypo-osmolality and hyponatremia: Secondary | ICD-10-CM | POA: Diagnosis not present

## 2015-10-01 DIAGNOSIS — R52 Pain, unspecified: Secondary | ICD-10-CM | POA: Diagnosis not present

## 2015-10-01 DIAGNOSIS — H3560 Retinal hemorrhage, unspecified eye: Secondary | ICD-10-CM | POA: Diagnosis not present

## 2015-10-01 DIAGNOSIS — E860 Dehydration: Secondary | ICD-10-CM | POA: Diagnosis not present

## 2015-10-01 DIAGNOSIS — Z7409 Other reduced mobility: Secondary | ICD-10-CM | POA: Diagnosis not present

## 2015-10-01 DIAGNOSIS — I10 Essential (primary) hypertension: Secondary | ICD-10-CM | POA: Diagnosis not present

## 2015-10-01 DIAGNOSIS — J9601 Acute respiratory failure with hypoxia: Secondary | ICD-10-CM | POA: Diagnosis not present

## 2015-10-01 DIAGNOSIS — Z472 Encounter for removal of internal fixation device: Secondary | ICD-10-CM | POA: Diagnosis not present

## 2015-10-01 DIAGNOSIS — R9431 Abnormal electrocardiogram [ECG] [EKG]: Secondary | ICD-10-CM | POA: Diagnosis not present

## 2015-10-01 DIAGNOSIS — H318 Other specified disorders of choroid: Secondary | ICD-10-CM | POA: Diagnosis not present

## 2015-10-01 DIAGNOSIS — Z9181 History of falling: Secondary | ICD-10-CM | POA: Diagnosis not present

## 2015-10-01 DIAGNOSIS — R296 Repeated falls: Secondary | ICD-10-CM | POA: Diagnosis not present

## 2015-10-01 DIAGNOSIS — M25472 Effusion, left ankle: Secondary | ICD-10-CM | POA: Diagnosis not present

## 2015-10-01 DIAGNOSIS — E861 Hypovolemia: Secondary | ICD-10-CM | POA: Diagnosis not present

## 2015-10-01 DIAGNOSIS — E1140 Type 2 diabetes mellitus with diabetic neuropathy, unspecified: Secondary | ICD-10-CM | POA: Diagnosis not present

## 2015-10-01 DIAGNOSIS — S82852B Displaced trimalleolar fracture of left lower leg, initial encounter for open fracture type I or II: Secondary | ICD-10-CM | POA: Diagnosis not present

## 2015-10-01 DIAGNOSIS — F05 Delirium due to known physiological condition: Secondary | ICD-10-CM | POA: Diagnosis not present

## 2015-10-01 DIAGNOSIS — I69393 Ataxia following cerebral infarction: Secondary | ICD-10-CM | POA: Diagnosis not present

## 2015-10-01 DIAGNOSIS — N179 Acute kidney failure, unspecified: Secondary | ICD-10-CM | POA: Diagnosis not present

## 2015-10-01 DIAGNOSIS — G629 Polyneuropathy, unspecified: Secondary | ICD-10-CM | POA: Diagnosis not present

## 2015-10-01 DIAGNOSIS — E669 Obesity, unspecified: Secondary | ICD-10-CM | POA: Diagnosis present

## 2015-10-01 DIAGNOSIS — E039 Hypothyroidism, unspecified: Secondary | ICD-10-CM | POA: Diagnosis not present

## 2015-10-01 DIAGNOSIS — M79672 Pain in left foot: Secondary | ICD-10-CM | POA: Diagnosis not present

## 2015-10-01 DIAGNOSIS — H547 Unspecified visual loss: Secondary | ICD-10-CM | POA: Diagnosis not present

## 2015-10-01 DIAGNOSIS — Z79899 Other long term (current) drug therapy: Secondary | ICD-10-CM | POA: Diagnosis not present

## 2015-10-01 DIAGNOSIS — R262 Difficulty in walking, not elsewhere classified: Secondary | ICD-10-CM | POA: Diagnosis not present

## 2015-10-01 DIAGNOSIS — D649 Anemia, unspecified: Secondary | ICD-10-CM | POA: Diagnosis not present

## 2015-10-01 DIAGNOSIS — I714 Abdominal aortic aneurysm, without rupture: Secondary | ICD-10-CM | POA: Diagnosis present

## 2015-10-01 DIAGNOSIS — R937 Abnormal findings on diagnostic imaging of other parts of musculoskeletal system: Secondary | ICD-10-CM | POA: Diagnosis not present

## 2015-10-01 DIAGNOSIS — K5900 Constipation, unspecified: Secondary | ICD-10-CM | POA: Diagnosis not present

## 2015-10-01 DIAGNOSIS — F0390 Unspecified dementia without behavioral disturbance: Secondary | ICD-10-CM | POA: Diagnosis not present

## 2015-10-01 DIAGNOSIS — H35052 Retinal neovascularization, unspecified, left eye: Secondary | ICD-10-CM | POA: Diagnosis not present

## 2015-10-01 DIAGNOSIS — N401 Enlarged prostate with lower urinary tract symptoms: Secondary | ICD-10-CM | POA: Diagnosis present

## 2015-10-01 DIAGNOSIS — M79605 Pain in left leg: Secondary | ICD-10-CM | POA: Diagnosis not present

## 2015-10-01 DIAGNOSIS — E876 Hypokalemia: Secondary | ICD-10-CM | POA: Diagnosis not present

## 2015-10-01 DIAGNOSIS — G4730 Sleep apnea, unspecified: Secondary | ICD-10-CM | POA: Diagnosis not present

## 2015-10-01 DIAGNOSIS — Z87891 Personal history of nicotine dependence: Secondary | ICD-10-CM | POA: Diagnosis not present

## 2015-10-01 DIAGNOSIS — D62 Acute posthemorrhagic anemia: Secondary | ICD-10-CM | POA: Diagnosis not present

## 2015-10-01 DIAGNOSIS — E0590 Thyrotoxicosis, unspecified without thyrotoxic crisis or storm: Secondary | ICD-10-CM | POA: Diagnosis not present

## 2015-10-01 DIAGNOSIS — E1142 Type 2 diabetes mellitus with diabetic polyneuropathy: Secondary | ICD-10-CM | POA: Diagnosis present

## 2015-10-01 DIAGNOSIS — G9340 Encephalopathy, unspecified: Secondary | ICD-10-CM | POA: Diagnosis not present

## 2015-10-01 DIAGNOSIS — G8918 Other acute postprocedural pain: Secondary | ICD-10-CM | POA: Diagnosis not present

## 2015-10-01 DIAGNOSIS — Z7902 Long term (current) use of antithrombotics/antiplatelets: Secondary | ICD-10-CM | POA: Diagnosis not present

## 2015-10-01 DIAGNOSIS — R339 Retention of urine, unspecified: Secondary | ICD-10-CM | POA: Diagnosis not present

## 2015-10-13 DIAGNOSIS — S82852D Displaced trimalleolar fracture of left lower leg, subsequent encounter for closed fracture with routine healing: Secondary | ICD-10-CM | POA: Diagnosis not present

## 2015-10-13 DIAGNOSIS — Z6830 Body mass index (BMI) 30.0-30.9, adult: Secondary | ICD-10-CM | POA: Diagnosis not present

## 2015-10-13 DIAGNOSIS — Z79899 Other long term (current) drug therapy: Secondary | ICD-10-CM | POA: Diagnosis not present

## 2015-10-13 DIAGNOSIS — J9611 Chronic respiratory failure with hypoxia: Secondary | ICD-10-CM | POA: Diagnosis not present

## 2015-10-13 DIAGNOSIS — G629 Polyneuropathy, unspecified: Secondary | ICD-10-CM | POA: Diagnosis not present

## 2015-10-13 DIAGNOSIS — L400 Psoriasis vulgaris: Secondary | ICD-10-CM | POA: Diagnosis not present

## 2015-10-13 DIAGNOSIS — E0590 Thyrotoxicosis, unspecified without thyrotoxic crisis or storm: Secondary | ICD-10-CM | POA: Diagnosis not present

## 2015-10-13 DIAGNOSIS — H353230 Exudative age-related macular degeneration, bilateral, stage unspecified: Secondary | ICD-10-CM | POA: Diagnosis not present

## 2015-10-13 DIAGNOSIS — J9601 Acute respiratory failure with hypoxia: Secondary | ICD-10-CM | POA: Diagnosis not present

## 2015-10-13 DIAGNOSIS — Z87891 Personal history of nicotine dependence: Secondary | ICD-10-CM | POA: Diagnosis not present

## 2015-10-13 DIAGNOSIS — I69393 Ataxia following cerebral infarction: Secondary | ICD-10-CM | POA: Diagnosis not present

## 2015-10-13 DIAGNOSIS — H02834 Dermatochalasis of left upper eyelid: Secondary | ICD-10-CM | POA: Diagnosis not present

## 2015-10-13 DIAGNOSIS — Z4789 Encounter for other orthopedic aftercare: Secondary | ICD-10-CM | POA: Diagnosis not present

## 2015-10-13 DIAGNOSIS — Z9181 History of falling: Secondary | ICD-10-CM | POA: Diagnosis not present

## 2015-10-13 DIAGNOSIS — Z7982 Long term (current) use of aspirin: Secondary | ICD-10-CM | POA: Diagnosis not present

## 2015-10-13 DIAGNOSIS — H318 Other specified disorders of choroid: Secondary | ICD-10-CM | POA: Diagnosis not present

## 2015-10-13 DIAGNOSIS — E871 Hypo-osmolality and hyponatremia: Secondary | ICD-10-CM | POA: Diagnosis not present

## 2015-10-13 DIAGNOSIS — H53452 Other localized visual field defect, left eye: Secondary | ICD-10-CM | POA: Diagnosis not present

## 2015-10-13 DIAGNOSIS — N189 Chronic kidney disease, unspecified: Secondary | ICD-10-CM | POA: Diagnosis not present

## 2015-10-13 DIAGNOSIS — G4730 Sleep apnea, unspecified: Secondary | ICD-10-CM | POA: Diagnosis not present

## 2015-10-13 DIAGNOSIS — Z8673 Personal history of transient ischemic attack (TIA), and cerebral infarction without residual deficits: Secondary | ICD-10-CM | POA: Diagnosis not present

## 2015-10-13 DIAGNOSIS — I10 Essential (primary) hypertension: Secondary | ICD-10-CM | POA: Diagnosis not present

## 2015-10-13 DIAGNOSIS — J449 Chronic obstructive pulmonary disease, unspecified: Secondary | ICD-10-CM | POA: Diagnosis not present

## 2015-10-13 DIAGNOSIS — R2689 Other abnormalities of gait and mobility: Secondary | ICD-10-CM | POA: Diagnosis not present

## 2015-10-13 DIAGNOSIS — X58XXXD Exposure to other specified factors, subsequent encounter: Secondary | ICD-10-CM | POA: Diagnosis not present

## 2015-10-13 DIAGNOSIS — R911 Solitary pulmonary nodule: Secondary | ICD-10-CM | POA: Diagnosis not present

## 2015-10-13 DIAGNOSIS — H02831 Dermatochalasis of right upper eyelid: Secondary | ICD-10-CM | POA: Diagnosis not present

## 2015-10-13 DIAGNOSIS — E876 Hypokalemia: Secondary | ICD-10-CM | POA: Diagnosis not present

## 2015-10-13 DIAGNOSIS — Z7951 Long term (current) use of inhaled steroids: Secondary | ICD-10-CM | POA: Diagnosis not present

## 2015-10-13 DIAGNOSIS — R52 Pain, unspecified: Secondary | ICD-10-CM | POA: Diagnosis not present

## 2015-10-13 DIAGNOSIS — Z8679 Personal history of other diseases of the circulatory system: Secondary | ICD-10-CM | POA: Diagnosis not present

## 2015-10-13 DIAGNOSIS — S82852A Displaced trimalleolar fracture of left lower leg, initial encounter for closed fracture: Secondary | ICD-10-CM | POA: Diagnosis not present

## 2015-10-13 DIAGNOSIS — F0390 Unspecified dementia without behavioral disturbance: Secondary | ICD-10-CM | POA: Diagnosis not present

## 2015-10-13 DIAGNOSIS — Z7409 Other reduced mobility: Secondary | ICD-10-CM | POA: Diagnosis not present

## 2015-10-13 DIAGNOSIS — R296 Repeated falls: Secondary | ICD-10-CM | POA: Diagnosis not present

## 2015-10-13 DIAGNOSIS — E119 Type 2 diabetes mellitus without complications: Secondary | ICD-10-CM | POA: Diagnosis not present

## 2015-10-13 DIAGNOSIS — I714 Abdominal aortic aneurysm, without rupture: Secondary | ICD-10-CM | POA: Diagnosis not present

## 2015-10-13 DIAGNOSIS — Z23 Encounter for immunization: Secondary | ICD-10-CM | POA: Diagnosis not present

## 2015-10-13 DIAGNOSIS — E1122 Type 2 diabetes mellitus with diabetic chronic kidney disease: Secondary | ICD-10-CM | POA: Diagnosis not present

## 2015-10-13 DIAGNOSIS — Z8781 Personal history of (healed) traumatic fracture: Secondary | ICD-10-CM | POA: Diagnosis not present

## 2015-10-13 DIAGNOSIS — R262 Difficulty in walking, not elsewhere classified: Secondary | ICD-10-CM | POA: Diagnosis not present

## 2015-10-13 DIAGNOSIS — E7800 Pure hypercholesterolemia, unspecified: Secondary | ICD-10-CM | POA: Diagnosis not present

## 2015-10-13 DIAGNOSIS — I129 Hypertensive chronic kidney disease with stage 1 through stage 4 chronic kidney disease, or unspecified chronic kidney disease: Secondary | ICD-10-CM | POA: Diagnosis not present

## 2015-10-13 DIAGNOSIS — H3530 Unspecified macular degeneration: Secondary | ICD-10-CM | POA: Diagnosis not present

## 2015-10-13 DIAGNOSIS — E039 Hypothyroidism, unspecified: Secondary | ICD-10-CM | POA: Diagnosis not present

## 2015-10-13 DIAGNOSIS — H2513 Age-related nuclear cataract, bilateral: Secondary | ICD-10-CM | POA: Diagnosis not present

## 2015-10-13 DIAGNOSIS — Z967 Presence of other bone and tendon implants: Secondary | ICD-10-CM | POA: Diagnosis not present

## 2015-10-13 DIAGNOSIS — Z9889 Other specified postprocedural states: Secondary | ICD-10-CM | POA: Diagnosis not present

## 2015-10-13 DIAGNOSIS — R0602 Shortness of breath: Secondary | ICD-10-CM | POA: Diagnosis not present

## 2015-10-20 DIAGNOSIS — H53452 Other localized visual field defect, left eye: Secondary | ICD-10-CM | POA: Diagnosis not present

## 2015-10-20 DIAGNOSIS — H3530 Unspecified macular degeneration: Secondary | ICD-10-CM | POA: Diagnosis not present

## 2015-10-20 DIAGNOSIS — H02831 Dermatochalasis of right upper eyelid: Secondary | ICD-10-CM | POA: Diagnosis not present

## 2015-10-20 DIAGNOSIS — Z6830 Body mass index (BMI) 30.0-30.9, adult: Secondary | ICD-10-CM | POA: Diagnosis not present

## 2015-10-20 DIAGNOSIS — I714 Abdominal aortic aneurysm, without rupture: Secondary | ICD-10-CM | POA: Diagnosis not present

## 2015-10-20 DIAGNOSIS — H353230 Exudative age-related macular degeneration, bilateral, stage unspecified: Secondary | ICD-10-CM | POA: Diagnosis not present

## 2015-10-20 DIAGNOSIS — J449 Chronic obstructive pulmonary disease, unspecified: Secondary | ICD-10-CM | POA: Diagnosis not present

## 2015-10-20 DIAGNOSIS — H02834 Dermatochalasis of left upper eyelid: Secondary | ICD-10-CM | POA: Diagnosis not present

## 2015-10-20 DIAGNOSIS — L400 Psoriasis vulgaris: Secondary | ICD-10-CM | POA: Diagnosis not present

## 2015-10-20 DIAGNOSIS — H2513 Age-related nuclear cataract, bilateral: Secondary | ICD-10-CM | POA: Diagnosis not present

## 2015-10-20 DIAGNOSIS — R0602 Shortness of breath: Secondary | ICD-10-CM | POA: Diagnosis not present

## 2015-10-20 DIAGNOSIS — J9611 Chronic respiratory failure with hypoxia: Secondary | ICD-10-CM | POA: Diagnosis not present

## 2015-10-20 DIAGNOSIS — R911 Solitary pulmonary nodule: Secondary | ICD-10-CM | POA: Diagnosis not present

## 2015-10-29 DIAGNOSIS — Z967 Presence of other bone and tendon implants: Secondary | ICD-10-CM | POA: Diagnosis not present

## 2015-10-29 DIAGNOSIS — Z9889 Other specified postprocedural states: Secondary | ICD-10-CM | POA: Diagnosis not present

## 2015-10-29 DIAGNOSIS — S82852D Displaced trimalleolar fracture of left lower leg, subsequent encounter for closed fracture with routine healing: Secondary | ICD-10-CM | POA: Diagnosis not present

## 2015-11-19 DIAGNOSIS — Z23 Encounter for immunization: Secondary | ICD-10-CM | POA: Diagnosis not present

## 2015-11-20 DIAGNOSIS — Z4789 Encounter for other orthopedic aftercare: Secondary | ICD-10-CM | POA: Diagnosis not present

## 2015-11-20 DIAGNOSIS — Z967 Presence of other bone and tendon implants: Secondary | ICD-10-CM | POA: Diagnosis not present

## 2015-11-20 DIAGNOSIS — S82852D Displaced trimalleolar fracture of left lower leg, subsequent encounter for closed fracture with routine healing: Secondary | ICD-10-CM | POA: Diagnosis not present

## 2015-11-20 DIAGNOSIS — Z8781 Personal history of (healed) traumatic fracture: Secondary | ICD-10-CM | POA: Diagnosis not present

## 2015-11-25 DIAGNOSIS — E1140 Type 2 diabetes mellitus with diabetic neuropathy, unspecified: Secondary | ICD-10-CM | POA: Diagnosis not present

## 2015-11-25 DIAGNOSIS — Z8679 Personal history of other diseases of the circulatory system: Secondary | ICD-10-CM | POA: Diagnosis not present

## 2015-11-25 DIAGNOSIS — Z87891 Personal history of nicotine dependence: Secondary | ICD-10-CM | POA: Diagnosis not present

## 2015-11-25 DIAGNOSIS — J449 Chronic obstructive pulmonary disease, unspecified: Secondary | ICD-10-CM | POA: Diagnosis not present

## 2015-11-25 DIAGNOSIS — Z7951 Long term (current) use of inhaled steroids: Secondary | ICD-10-CM | POA: Diagnosis not present

## 2015-11-25 DIAGNOSIS — H35053 Retinal neovascularization, unspecified, bilateral: Secondary | ICD-10-CM | POA: Diagnosis not present

## 2015-11-25 DIAGNOSIS — E7800 Pure hypercholesterolemia, unspecified: Secondary | ICD-10-CM | POA: Diagnosis not present

## 2015-11-25 DIAGNOSIS — Z79899 Other long term (current) drug therapy: Secondary | ICD-10-CM | POA: Diagnosis not present

## 2015-11-25 DIAGNOSIS — I10 Essential (primary) hypertension: Secondary | ICD-10-CM | POA: Diagnosis not present

## 2015-11-25 DIAGNOSIS — H02834 Dermatochalasis of left upper eyelid: Secondary | ICD-10-CM | POA: Diagnosis not present

## 2015-11-25 DIAGNOSIS — E1136 Type 2 diabetes mellitus with diabetic cataract: Secondary | ICD-10-CM | POA: Diagnosis not present

## 2015-11-25 DIAGNOSIS — H02831 Dermatochalasis of right upper eyelid: Secondary | ICD-10-CM | POA: Diagnosis not present

## 2015-11-25 DIAGNOSIS — H349 Unspecified retinal vascular occlusion: Secondary | ICD-10-CM | POA: Diagnosis not present

## 2015-11-25 DIAGNOSIS — Z8673 Personal history of transient ischemic attack (TIA), and cerebral infarction without residual deficits: Secondary | ICD-10-CM | POA: Diagnosis not present

## 2015-11-25 DIAGNOSIS — H269 Unspecified cataract: Secondary | ICD-10-CM | POA: Diagnosis not present

## 2015-11-25 DIAGNOSIS — Z7902 Long term (current) use of antithrombotics/antiplatelets: Secondary | ICD-10-CM | POA: Diagnosis not present

## 2015-12-21 DIAGNOSIS — G4733 Obstructive sleep apnea (adult) (pediatric): Secondary | ICD-10-CM | POA: Diagnosis not present

## 2015-12-21 DIAGNOSIS — I714 Abdominal aortic aneurysm, without rupture: Secondary | ICD-10-CM | POA: Diagnosis not present

## 2015-12-21 DIAGNOSIS — Z6830 Body mass index (BMI) 30.0-30.9, adult: Secondary | ICD-10-CM | POA: Diagnosis not present

## 2015-12-21 DIAGNOSIS — J9611 Chronic respiratory failure with hypoxia: Secondary | ICD-10-CM | POA: Diagnosis not present

## 2015-12-21 DIAGNOSIS — F17210 Nicotine dependence, cigarettes, uncomplicated: Secondary | ICD-10-CM | POA: Diagnosis not present

## 2015-12-21 DIAGNOSIS — J449 Chronic obstructive pulmonary disease, unspecified: Secondary | ICD-10-CM | POA: Diagnosis not present

## 2015-12-25 DIAGNOSIS — Z87891 Personal history of nicotine dependence: Secondary | ICD-10-CM | POA: Diagnosis not present

## 2015-12-25 DIAGNOSIS — H353221 Exudative age-related macular degeneration, left eye, with active choroidal neovascularization: Secondary | ICD-10-CM | POA: Diagnosis not present

## 2015-12-25 DIAGNOSIS — Z79899 Other long term (current) drug therapy: Secondary | ICD-10-CM | POA: Diagnosis not present

## 2015-12-25 DIAGNOSIS — H2513 Age-related nuclear cataract, bilateral: Secondary | ICD-10-CM | POA: Diagnosis not present

## 2015-12-25 DIAGNOSIS — Z7902 Long term (current) use of antithrombotics/antiplatelets: Secondary | ICD-10-CM | POA: Diagnosis not present

## 2015-12-25 DIAGNOSIS — Z7951 Long term (current) use of inhaled steroids: Secondary | ICD-10-CM | POA: Diagnosis not present

## 2015-12-25 DIAGNOSIS — J449 Chronic obstructive pulmonary disease, unspecified: Secondary | ICD-10-CM | POA: Diagnosis not present

## 2015-12-25 DIAGNOSIS — Z8673 Personal history of transient ischemic attack (TIA), and cerebral infarction without residual deficits: Secondary | ICD-10-CM | POA: Diagnosis not present

## 2015-12-25 DIAGNOSIS — H26493 Other secondary cataract, bilateral: Secondary | ICD-10-CM | POA: Diagnosis not present

## 2015-12-25 DIAGNOSIS — E1136 Type 2 diabetes mellitus with diabetic cataract: Secondary | ICD-10-CM | POA: Diagnosis not present

## 2015-12-25 DIAGNOSIS — E7800 Pure hypercholesterolemia, unspecified: Secondary | ICD-10-CM | POA: Diagnosis not present

## 2015-12-25 DIAGNOSIS — H02831 Dermatochalasis of right upper eyelid: Secondary | ICD-10-CM | POA: Diagnosis not present

## 2015-12-25 DIAGNOSIS — H35053 Retinal neovascularization, unspecified, bilateral: Secondary | ICD-10-CM | POA: Diagnosis not present

## 2015-12-25 DIAGNOSIS — H02834 Dermatochalasis of left upper eyelid: Secondary | ICD-10-CM | POA: Diagnosis not present

## 2015-12-25 DIAGNOSIS — H349 Unspecified retinal vascular occlusion: Secondary | ICD-10-CM | POA: Diagnosis not present

## 2015-12-25 DIAGNOSIS — I10 Essential (primary) hypertension: Secondary | ICD-10-CM | POA: Diagnosis not present

## 2015-12-26 DIAGNOSIS — J449 Chronic obstructive pulmonary disease, unspecified: Secondary | ICD-10-CM | POA: Diagnosis not present

## 2015-12-26 DIAGNOSIS — K210 Gastro-esophageal reflux disease with esophagitis: Secondary | ICD-10-CM | POA: Diagnosis not present

## 2015-12-26 DIAGNOSIS — E1142 Type 2 diabetes mellitus with diabetic polyneuropathy: Secondary | ICD-10-CM | POA: Diagnosis not present

## 2015-12-26 DIAGNOSIS — E782 Mixed hyperlipidemia: Secondary | ICD-10-CM | POA: Diagnosis not present

## 2015-12-26 DIAGNOSIS — I10 Essential (primary) hypertension: Secondary | ICD-10-CM | POA: Diagnosis not present

## 2015-12-26 DIAGNOSIS — E1122 Type 2 diabetes mellitus with diabetic chronic kidney disease: Secondary | ICD-10-CM | POA: Diagnosis not present

## 2015-12-26 DIAGNOSIS — E039 Hypothyroidism, unspecified: Secondary | ICD-10-CM | POA: Diagnosis not present

## 2015-12-26 DIAGNOSIS — E876 Hypokalemia: Secondary | ICD-10-CM | POA: Diagnosis not present

## 2015-12-31 DIAGNOSIS — I10 Essential (primary) hypertension: Secondary | ICD-10-CM | POA: Diagnosis not present

## 2015-12-31 DIAGNOSIS — I714 Abdominal aortic aneurysm, without rupture: Secondary | ICD-10-CM | POA: Diagnosis not present

## 2015-12-31 DIAGNOSIS — I739 Peripheral vascular disease, unspecified: Secondary | ICD-10-CM | POA: Diagnosis not present

## 2015-12-31 DIAGNOSIS — E1122 Type 2 diabetes mellitus with diabetic chronic kidney disease: Secondary | ICD-10-CM | POA: Diagnosis not present

## 2015-12-31 DIAGNOSIS — F331 Major depressive disorder, recurrent, moderate: Secondary | ICD-10-CM | POA: Diagnosis not present

## 2015-12-31 DIAGNOSIS — E782 Mixed hyperlipidemia: Secondary | ICD-10-CM | POA: Diagnosis not present

## 2015-12-31 DIAGNOSIS — E6609 Other obesity due to excess calories: Secondary | ICD-10-CM | POA: Diagnosis not present

## 2015-12-31 DIAGNOSIS — E1142 Type 2 diabetes mellitus with diabetic polyneuropathy: Secondary | ICD-10-CM | POA: Diagnosis not present

## 2016-01-12 DIAGNOSIS — E1122 Type 2 diabetes mellitus with diabetic chronic kidney disease: Secondary | ICD-10-CM | POA: Diagnosis not present

## 2016-01-20 DIAGNOSIS — E1165 Type 2 diabetes mellitus with hyperglycemia: Secondary | ICD-10-CM | POA: Diagnosis not present

## 2016-01-20 DIAGNOSIS — I10 Essential (primary) hypertension: Secondary | ICD-10-CM | POA: Diagnosis not present

## 2016-01-20 DIAGNOSIS — K210 Gastro-esophageal reflux disease with esophagitis: Secondary | ICD-10-CM | POA: Diagnosis not present

## 2016-01-26 DIAGNOSIS — Z79899 Other long term (current) drug therapy: Secondary | ICD-10-CM | POA: Diagnosis not present

## 2016-01-26 DIAGNOSIS — Z7902 Long term (current) use of antithrombotics/antiplatelets: Secondary | ICD-10-CM | POA: Diagnosis not present

## 2016-01-26 DIAGNOSIS — H2513 Age-related nuclear cataract, bilateral: Secondary | ICD-10-CM | POA: Diagnosis not present

## 2016-01-26 DIAGNOSIS — H35053 Retinal neovascularization, unspecified, bilateral: Secondary | ICD-10-CM | POA: Diagnosis not present

## 2016-01-26 DIAGNOSIS — I10 Essential (primary) hypertension: Secondary | ICD-10-CM | POA: Diagnosis not present

## 2016-01-26 DIAGNOSIS — E119 Type 2 diabetes mellitus without complications: Secondary | ICD-10-CM | POA: Diagnosis not present

## 2016-01-26 DIAGNOSIS — E7800 Pure hypercholesterolemia, unspecified: Secondary | ICD-10-CM | POA: Diagnosis not present

## 2016-01-26 DIAGNOSIS — H349 Unspecified retinal vascular occlusion: Secondary | ICD-10-CM | POA: Diagnosis not present

## 2016-01-26 DIAGNOSIS — Z87891 Personal history of nicotine dependence: Secondary | ICD-10-CM | POA: Diagnosis not present

## 2016-01-26 DIAGNOSIS — J449 Chronic obstructive pulmonary disease, unspecified: Secondary | ICD-10-CM | POA: Diagnosis not present

## 2016-03-31 DIAGNOSIS — E1142 Type 2 diabetes mellitus with diabetic polyneuropathy: Secondary | ICD-10-CM | POA: Diagnosis not present

## 2016-03-31 DIAGNOSIS — E876 Hypokalemia: Secondary | ICD-10-CM | POA: Diagnosis not present

## 2016-03-31 DIAGNOSIS — J449 Chronic obstructive pulmonary disease, unspecified: Secondary | ICD-10-CM | POA: Diagnosis not present

## 2016-03-31 DIAGNOSIS — E782 Mixed hyperlipidemia: Secondary | ICD-10-CM | POA: Diagnosis not present

## 2016-03-31 DIAGNOSIS — I10 Essential (primary) hypertension: Secondary | ICD-10-CM | POA: Diagnosis not present

## 2016-03-31 DIAGNOSIS — E1122 Type 2 diabetes mellitus with diabetic chronic kidney disease: Secondary | ICD-10-CM | POA: Diagnosis not present

## 2016-04-07 DIAGNOSIS — I10 Essential (primary) hypertension: Secondary | ICD-10-CM | POA: Diagnosis not present

## 2016-04-07 DIAGNOSIS — G43909 Migraine, unspecified, not intractable, without status migrainosus: Secondary | ICD-10-CM | POA: Diagnosis not present

## 2016-04-07 DIAGNOSIS — E1142 Type 2 diabetes mellitus with diabetic polyneuropathy: Secondary | ICD-10-CM | POA: Diagnosis not present

## 2016-04-07 DIAGNOSIS — Z23 Encounter for immunization: Secondary | ICD-10-CM | POA: Diagnosis not present

## 2016-04-07 DIAGNOSIS — E782 Mixed hyperlipidemia: Secondary | ICD-10-CM | POA: Diagnosis not present

## 2016-04-07 DIAGNOSIS — G6289 Other specified polyneuropathies: Secondary | ICD-10-CM | POA: Diagnosis not present

## 2016-04-07 DIAGNOSIS — E1122 Type 2 diabetes mellitus with diabetic chronic kidney disease: Secondary | ICD-10-CM | POA: Diagnosis not present

## 2016-04-07 DIAGNOSIS — Z1389 Encounter for screening for other disorder: Secondary | ICD-10-CM | POA: Diagnosis not present

## 2016-05-13 DIAGNOSIS — J9601 Acute respiratory failure with hypoxia: Secondary | ICD-10-CM | POA: Diagnosis not present

## 2016-05-14 DIAGNOSIS — J440 Chronic obstructive pulmonary disease with acute lower respiratory infection: Secondary | ICD-10-CM | POA: Diagnosis not present

## 2016-05-14 DIAGNOSIS — J9601 Acute respiratory failure with hypoxia: Secondary | ICD-10-CM | POA: Diagnosis not present

## 2017-03-21 DIAGNOSIS — Z79899 Other long term (current) drug therapy: Secondary | ICD-10-CM | POA: Diagnosis not present

## 2017-03-21 DIAGNOSIS — E119 Type 2 diabetes mellitus without complications: Secondary | ICD-10-CM | POA: Diagnosis not present

## 2017-03-21 DIAGNOSIS — R05 Cough: Secondary | ICD-10-CM | POA: Diagnosis not present

## 2017-03-21 DIAGNOSIS — Z7902 Long term (current) use of antithrombotics/antiplatelets: Secondary | ICD-10-CM | POA: Diagnosis not present

## 2017-03-21 DIAGNOSIS — J441 Chronic obstructive pulmonary disease with (acute) exacerbation: Secondary | ICD-10-CM | POA: Diagnosis not present

## 2017-03-21 DIAGNOSIS — J440 Chronic obstructive pulmonary disease with acute lower respiratory infection: Secondary | ICD-10-CM | POA: Diagnosis not present

## 2017-03-21 DIAGNOSIS — F17210 Nicotine dependence, cigarettes, uncomplicated: Secondary | ICD-10-CM | POA: Diagnosis not present

## 2017-03-21 DIAGNOSIS — Z720 Tobacco use: Secondary | ICD-10-CM | POA: Diagnosis not present

## 2017-03-21 DIAGNOSIS — I10 Essential (primary) hypertension: Secondary | ICD-10-CM | POA: Diagnosis not present

## 2017-03-21 DIAGNOSIS — J209 Acute bronchitis, unspecified: Secondary | ICD-10-CM | POA: Diagnosis not present

## 2017-03-21 DIAGNOSIS — R079 Chest pain, unspecified: Secondary | ICD-10-CM | POA: Diagnosis not present

## 2017-03-21 DIAGNOSIS — Z7951 Long term (current) use of inhaled steroids: Secondary | ICD-10-CM | POA: Diagnosis not present

## 2017-03-22 DIAGNOSIS — J440 Chronic obstructive pulmonary disease with acute lower respiratory infection: Secondary | ICD-10-CM | POA: Diagnosis not present

## 2017-03-22 DIAGNOSIS — Z6832 Body mass index (BMI) 32.0-32.9, adult: Secondary | ICD-10-CM | POA: Diagnosis not present

## 2017-03-22 DIAGNOSIS — F17210 Nicotine dependence, cigarettes, uncomplicated: Secondary | ICD-10-CM | POA: Diagnosis not present

## 2017-04-10 ENCOUNTER — Encounter (HOSPITAL_COMMUNITY): Payer: Self-pay | Admitting: Orthopedic Surgery

## 2017-04-10 ENCOUNTER — Inpatient Hospital Stay (HOSPITAL_COMMUNITY): Payer: Medicare HMO | Admitting: Certified Registered Nurse Anesthetist

## 2017-04-10 ENCOUNTER — Inpatient Hospital Stay (HOSPITAL_COMMUNITY)
Admission: AD | Admit: 2017-04-10 | Discharge: 2017-04-15 | DRG: 492 | Disposition: A | Discharge status: Skilled Nursing Facility | Payer: Medicare HMO | Source: Other Acute Inpatient Hospital | Attending: Orthopedic Surgery | Admitting: Orthopedic Surgery

## 2017-04-10 ENCOUNTER — Encounter (HOSPITAL_COMMUNITY)
Admission: AD | Disposition: A | Discharge status: Skilled Nursing Facility | Payer: Self-pay | Source: Other Acute Inpatient Hospital | Attending: Orthopedic Surgery

## 2017-04-10 ENCOUNTER — Inpatient Hospital Stay (HOSPITAL_COMMUNITY): Payer: Medicare HMO

## 2017-04-10 DIAGNOSIS — E1122 Type 2 diabetes mellitus with diabetic chronic kidney disease: Secondary | ICD-10-CM | POA: Diagnosis not present

## 2017-04-10 DIAGNOSIS — R0902 Hypoxemia: Secondary | ICD-10-CM | POA: Diagnosis not present

## 2017-04-10 DIAGNOSIS — S82232B Displaced oblique fracture of shaft of left tibia, initial encounter for open fracture type I or II: Secondary | ICD-10-CM | POA: Diagnosis not present

## 2017-04-10 DIAGNOSIS — E669 Obesity, unspecified: Secondary | ICD-10-CM | POA: Diagnosis present

## 2017-04-10 DIAGNOSIS — E1151 Type 2 diabetes mellitus with diabetic peripheral angiopathy without gangrene: Secondary | ICD-10-CM | POA: Diagnosis not present

## 2017-04-10 DIAGNOSIS — R05 Cough: Secondary | ICD-10-CM | POA: Diagnosis not present

## 2017-04-10 DIAGNOSIS — S82252B Displaced comminuted fracture of shaft of left tibia, initial encounter for open fracture type I or II: Secondary | ICD-10-CM | POA: Diagnosis present

## 2017-04-10 DIAGNOSIS — S82202D Unspecified fracture of shaft of left tibia, subsequent encounter for closed fracture with routine healing: Secondary | ICD-10-CM | POA: Diagnosis not present

## 2017-04-10 DIAGNOSIS — S299XXA Unspecified injury of thorax, initial encounter: Secondary | ICD-10-CM | POA: Diagnosis not present

## 2017-04-10 DIAGNOSIS — Z9889 Other specified postprocedural states: Secondary | ICD-10-CM

## 2017-04-10 DIAGNOSIS — Z8781 Personal history of (healed) traumatic fracture: Secondary | ICD-10-CM | POA: Diagnosis not present

## 2017-04-10 DIAGNOSIS — S82202B Unspecified fracture of shaft of left tibia, initial encounter for open fracture type I or II: Secondary | ICD-10-CM | POA: Diagnosis not present

## 2017-04-10 DIAGNOSIS — X58XXXD Exposure to other specified factors, subsequent encounter: Secondary | ICD-10-CM | POA: Diagnosis not present

## 2017-04-10 DIAGNOSIS — S8990XA Unspecified injury of unspecified lower leg, initial encounter: Secondary | ICD-10-CM | POA: Diagnosis not present

## 2017-04-10 DIAGNOSIS — I429 Cardiomyopathy, unspecified: Secondary | ICD-10-CM | POA: Diagnosis not present

## 2017-04-10 DIAGNOSIS — Z8249 Family history of ischemic heart disease and other diseases of the circulatory system: Secondary | ICD-10-CM | POA: Diagnosis not present

## 2017-04-10 DIAGNOSIS — S82872B Displaced pilon fracture of left tibia, initial encounter for open fracture type I or II: Secondary | ICD-10-CM | POA: Diagnosis not present

## 2017-04-10 DIAGNOSIS — D631 Anemia in chronic kidney disease: Secondary | ICD-10-CM | POA: Diagnosis present

## 2017-04-10 DIAGNOSIS — K219 Gastro-esophageal reflux disease without esophagitis: Secondary | ICD-10-CM | POA: Diagnosis not present

## 2017-04-10 DIAGNOSIS — S82402D Unspecified fracture of shaft of left fibula, subsequent encounter for closed fracture with routine healing: Secondary | ICD-10-CM | POA: Diagnosis not present

## 2017-04-10 DIAGNOSIS — N184 Chronic kidney disease, stage 4 (severe): Secondary | ICD-10-CM | POA: Diagnosis not present

## 2017-04-10 DIAGNOSIS — F329 Major depressive disorder, single episode, unspecified: Secondary | ICD-10-CM | POA: Diagnosis not present

## 2017-04-10 DIAGNOSIS — M6281 Muscle weakness (generalized): Secondary | ICD-10-CM | POA: Diagnosis not present

## 2017-04-10 DIAGNOSIS — G4733 Obstructive sleep apnea (adult) (pediatric): Secondary | ICD-10-CM | POA: Diagnosis not present

## 2017-04-10 DIAGNOSIS — F17210 Nicotine dependence, cigarettes, uncomplicated: Secondary | ICD-10-CM | POA: Diagnosis present

## 2017-04-10 DIAGNOSIS — Z79899 Other long term (current) drug therapy: Secondary | ICD-10-CM

## 2017-04-10 DIAGNOSIS — Y92010 Kitchen of single-family (private) house as the place of occurrence of the external cause: Secondary | ICD-10-CM

## 2017-04-10 DIAGNOSIS — Z419 Encounter for procedure for purposes other than remedying health state, unspecified: Secondary | ICD-10-CM

## 2017-04-10 DIAGNOSIS — E1142 Type 2 diabetes mellitus with diabetic polyneuropathy: Secondary | ICD-10-CM | POA: Diagnosis not present

## 2017-04-10 DIAGNOSIS — G4730 Sleep apnea, unspecified: Secondary | ICD-10-CM | POA: Diagnosis not present

## 2017-04-10 DIAGNOSIS — I10 Essential (primary) hypertension: Secondary | ICD-10-CM | POA: Diagnosis present

## 2017-04-10 DIAGNOSIS — Z967 Presence of other bone and tendon implants: Secondary | ICD-10-CM | POA: Diagnosis not present

## 2017-04-10 DIAGNOSIS — Z720 Tobacco use: Secondary | ICD-10-CM | POA: Diagnosis present

## 2017-04-10 DIAGNOSIS — Z8673 Personal history of transient ischemic attack (TIA), and cerebral infarction without residual deficits: Secondary | ICD-10-CM

## 2017-04-10 DIAGNOSIS — W010XXA Fall on same level from slipping, tripping and stumbling without subsequent striking against object, initial encounter: Secondary | ICD-10-CM | POA: Diagnosis present

## 2017-04-10 DIAGNOSIS — I129 Hypertensive chronic kidney disease with stage 1 through stage 4 chronic kidney disease, or unspecified chronic kidney disease: Secondary | ICD-10-CM | POA: Diagnosis not present

## 2017-04-10 DIAGNOSIS — I714 Abdominal aortic aneurysm, without rupture: Secondary | ICD-10-CM | POA: Diagnosis not present

## 2017-04-10 DIAGNOSIS — Z794 Long term (current) use of insulin: Secondary | ICD-10-CM

## 2017-04-10 DIAGNOSIS — E782 Mixed hyperlipidemia: Secondary | ICD-10-CM | POA: Diagnosis present

## 2017-04-10 DIAGNOSIS — N183 Chronic kidney disease, stage 3 (moderate): Secondary | ICD-10-CM | POA: Diagnosis not present

## 2017-04-10 DIAGNOSIS — Z833 Family history of diabetes mellitus: Secondary | ICD-10-CM

## 2017-04-10 DIAGNOSIS — N289 Disorder of kidney and ureter, unspecified: Secondary | ICD-10-CM | POA: Diagnosis not present

## 2017-04-10 DIAGNOSIS — J449 Chronic obstructive pulmonary disease, unspecified: Secondary | ICD-10-CM | POA: Diagnosis present

## 2017-04-10 DIAGNOSIS — S82402B Unspecified fracture of shaft of left fibula, initial encounter for open fracture type I or II: Secondary | ICD-10-CM | POA: Diagnosis not present

## 2017-04-10 DIAGNOSIS — Z7951 Long term (current) use of inhaled steroids: Secondary | ICD-10-CM

## 2017-04-10 DIAGNOSIS — F419 Anxiety disorder, unspecified: Secondary | ICD-10-CM | POA: Diagnosis not present

## 2017-04-10 DIAGNOSIS — Z79890 Hormone replacement therapy (postmenopausal): Secondary | ICD-10-CM

## 2017-04-10 DIAGNOSIS — Z7982 Long term (current) use of aspirin: Secondary | ICD-10-CM

## 2017-04-10 DIAGNOSIS — R1312 Dysphagia, oropharyngeal phase: Secondary | ICD-10-CM | POA: Diagnosis not present

## 2017-04-10 DIAGNOSIS — Z8349 Family history of other endocrine, nutritional and metabolic diseases: Secondary | ICD-10-CM | POA: Diagnosis not present

## 2017-04-10 DIAGNOSIS — E039 Hypothyroidism, unspecified: Secondary | ICD-10-CM | POA: Diagnosis not present

## 2017-04-10 DIAGNOSIS — Y9389 Activity, other specified: Secondary | ICD-10-CM

## 2017-04-10 DIAGNOSIS — Z9981 Dependence on supplemental oxygen: Secondary | ICD-10-CM | POA: Diagnosis not present

## 2017-04-10 DIAGNOSIS — E7800 Pure hypercholesterolemia, unspecified: Secondary | ICD-10-CM | POA: Diagnosis not present

## 2017-04-10 DIAGNOSIS — S82262B Displaced segmental fracture of shaft of left tibia, initial encounter for open fracture type I or II: Secondary | ICD-10-CM | POA: Diagnosis not present

## 2017-04-10 DIAGNOSIS — J9621 Acute and chronic respiratory failure with hypoxia: Secondary | ICD-10-CM | POA: Diagnosis not present

## 2017-04-10 DIAGNOSIS — W19XXXA Unspecified fall, initial encounter: Secondary | ICD-10-CM | POA: Diagnosis not present

## 2017-04-10 DIAGNOSIS — R0602 Shortness of breath: Secondary | ICD-10-CM

## 2017-04-10 DIAGNOSIS — G8911 Acute pain due to trauma: Secondary | ICD-10-CM | POA: Diagnosis not present

## 2017-04-10 DIAGNOSIS — G2581 Restless legs syndrome: Secondary | ICD-10-CM | POA: Diagnosis not present

## 2017-04-10 DIAGNOSIS — Z9181 History of falling: Secondary | ICD-10-CM | POA: Diagnosis not present

## 2017-04-10 DIAGNOSIS — S82252A Displaced comminuted fracture of shaft of left tibia, initial encounter for closed fracture: Secondary | ICD-10-CM | POA: Diagnosis not present

## 2017-04-10 DIAGNOSIS — S82252D Displaced comminuted fracture of shaft of left tibia, subsequent encounter for closed fracture with routine healing: Secondary | ICD-10-CM | POA: Diagnosis not present

## 2017-04-10 DIAGNOSIS — D649 Anemia, unspecified: Secondary | ICD-10-CM | POA: Diagnosis not present

## 2017-04-10 HISTORY — DX: Unspecified fracture of shaft of left fibula, initial encounter for open fracture type I or II: S82.402B

## 2017-04-10 HISTORY — DX: Unspecified fracture of shaft of left tibia, initial encounter for open fracture type I or II: S82.202B

## 2017-04-10 HISTORY — PX: TIBIA IM NAIL INSERTION: SHX2516

## 2017-04-10 LAB — CBC
HCT: 35.7 % — ABNORMAL LOW (ref 39.0–52.0)
Hemoglobin: 11 g/dL — ABNORMAL LOW (ref 13.0–17.0)
MCH: 26.1 pg (ref 26.0–34.0)
MCHC: 30.8 g/dL (ref 30.0–36.0)
MCV: 84.6 fL (ref 78.0–100.0)
Platelets: 165 10*3/uL (ref 150–400)
RBC: 4.22 MIL/uL (ref 4.22–5.81)
RDW: 16.9 % — ABNORMAL HIGH (ref 11.5–15.5)
WBC: 12.3 10*3/uL — ABNORMAL HIGH (ref 4.0–10.5)

## 2017-04-10 LAB — CREATININE, SERUM
Creatinine, Ser: 2.71 mg/dL — ABNORMAL HIGH (ref 0.61–1.24)
GFR calc Af Amer: 27 mL/min — ABNORMAL LOW (ref >60)
GFR calc non Af Amer: 23 mL/min — ABNORMAL LOW (ref >60)

## 2017-04-10 LAB — GLUCOSE, CAPILLARY
Glucose-Capillary: 167 mg/dL — ABNORMAL HIGH (ref 65–99)
Glucose-Capillary: 218 mg/dL — ABNORMAL HIGH (ref 65–99)

## 2017-04-10 SURGERY — INSERTION, INTRAMEDULLARY ROD, TIBIA
Anesthesia: General | Site: Leg Upper | Laterality: Left

## 2017-04-10 MED ORDER — INSULIN GLARGINE 100 UNIT/ML SOLOSTAR PEN: 100.0000 [IU] | PEN_INJECTOR | Freq: Every day | SUBCUTANEOUS | Status: DC

## 2017-04-10 MED ORDER — LEVOTHYROXINE SODIUM 50 MCG PO TABS
50.0000 ug | ORAL_TABLET | Freq: Every day | ORAL | Status: DC
  Administered 2017-04-11 – 2017-04-15 (×5): 50 ug via ORAL
  Filled 2017-04-10 (×5): qty 1

## 2017-04-10 MED ORDER — ENOXAPARIN SODIUM 30 MG/0.3ML ~~LOC~~ SOLN: 30.0000 mg | SUBCUTANEOUS | Status: DC

## 2017-04-10 MED ORDER — PHENYLEPHRINE HCL 10 MG/ML IJ SOLN
INTRAVENOUS | Status: DC | PRN
  Administered 2017-04-10: 20 ug/min via INTRAVENOUS

## 2017-04-10 MED ORDER — 0.9 % SODIUM CHLORIDE (POUR BTL) OPTIME
TOPICAL | Status: DC | PRN
  Administered 2017-04-10: 1000 mL

## 2017-04-10 MED ORDER — IPRATROPIUM-ALBUTEROL 0.5-2.5 (3) MG/3ML IN SOLN
RESPIRATORY_TRACT | Status: AC
  Filled 2017-04-10: qty 3

## 2017-04-10 MED ORDER — METHOCARBAMOL 500 MG PO TABS
500.0000 mg | ORAL_TABLET | Freq: Four times a day (QID) | ORAL | Status: DC | PRN
  Administered 2017-04-10 – 2017-04-15 (×7): 500 mg via ORAL
  Filled 2017-04-10 (×6): qty 1

## 2017-04-10 MED ORDER — HYDROMORPHONE HCL 1 MG/ML IJ SOLN
INTRAMUSCULAR | Status: AC
  Administered 2017-04-10: 0.5 mg via INTRAVENOUS
  Filled 2017-04-10: qty 1

## 2017-04-10 MED ORDER — FENTANYL CITRATE (PF) 250 MCG/5ML IJ SOLN
INTRAMUSCULAR | Status: DC | PRN
  Administered 2017-04-10 (×3): 50 ug via INTRAVENOUS

## 2017-04-10 MED ORDER — TIOTROPIUM BROMIDE MONOHYDRATE 18 MCG IN CAPS
18.0000 ug | ORAL_CAPSULE | Freq: Every day | RESPIRATORY_TRACT | Status: DC
  Administered 2017-04-11 – 2017-04-15 (×5): 18 ug via RESPIRATORY_TRACT
  Filled 2017-04-10: qty 5

## 2017-04-10 MED ORDER — CEFAZOLIN SODIUM-DEXTROSE 1-4 GM/50ML-% IV SOLN
1.0000 g | Freq: Four times a day (QID) | INTRAVENOUS | Status: DC
  Administered 2017-04-10 (×2): 1 g via INTRAVENOUS
  Filled 2017-04-10 (×4): qty 50

## 2017-04-10 MED ORDER — OXYCODONE HCL 5 MG PO TABS: 10.0000 mg | ORAL_TABLET | ORAL | Status: DC | PRN

## 2017-04-10 MED ORDER — ACETAMINOPHEN 325 MG PO TABS: 325.0000 mg | ORAL_TABLET | Freq: Four times a day (QID) | ORAL | Status: DC | PRN

## 2017-04-10 MED ORDER — VENLAFAXINE HCL ER 75 MG PO CP24
75.0000 mg | ORAL_CAPSULE | Freq: Every day | ORAL | Status: DC
  Administered 2017-04-11 – 2017-04-15 (×5): 75 mg via ORAL
  Filled 2017-04-10 (×5): qty 1

## 2017-04-10 MED ORDER — ATORVASTATIN CALCIUM 10 MG PO TABS
10.0000 mg | ORAL_TABLET | Freq: Every day | ORAL | Status: DC
  Administered 2017-04-11 (×2): 10 mg via ORAL
  Filled 2017-04-10 (×2): qty 1

## 2017-04-10 MED ORDER — ALBUTEROL SULFATE (2.5 MG/3ML) 0.083% IN NEBU
2.5000 mg | INHALATION_SOLUTION | Freq: Four times a day (QID) | RESPIRATORY_TRACT | Status: AC | PRN
  Administered 2017-04-10: 2.5 mg via RESPIRATORY_TRACT

## 2017-04-10 MED ORDER — LACTATED RINGERS IV SOLN
INTRAVENOUS | Status: DC | PRN
  Administered 2017-04-10 (×2): via INTRAVENOUS

## 2017-04-10 MED ORDER — POLYETHYLENE GLYCOL 3350 17 G PO PACK
17.0000 g | PACK | Freq: Every day | ORAL | Status: DC | PRN
  Administered 2017-04-13: 17 g via ORAL
  Filled 2017-04-10: qty 1

## 2017-04-10 MED ORDER — ROCURONIUM BROMIDE 10 MG/ML (PF) SYRINGE
PREFILLED_SYRINGE | INTRAVENOUS | Status: DC | PRN
  Administered 2017-04-10: 20 mg via INTRAVENOUS
  Administered 2017-04-10: 40 mg via INTRAVENOUS

## 2017-04-10 MED ORDER — METFORMIN HCL ER 500 MG PO TB24: 500.0000 mg | ORAL_TABLET | Freq: Two times a day (BID) | ORAL | Status: DC

## 2017-04-10 MED ORDER — ONDANSETRON HCL 4 MG/2ML IJ SOLN: 4.0000 mg | Freq: Four times a day (QID) | INTRAMUSCULAR | Status: DC | PRN

## 2017-04-10 MED ORDER — METHOCARBAMOL 1000 MG/10ML IJ SOLN
500.0000 mg | Freq: Four times a day (QID) | INTRAVENOUS | Status: DC | PRN
  Filled 2017-04-10: qty 5

## 2017-04-10 MED ORDER — BUPIVACAINE HCL (PF) 0.25 % IJ SOLN
INTRAMUSCULAR | Status: DC | PRN
  Administered 2017-04-10: 20 mL

## 2017-04-10 MED ORDER — BISACODYL 10 MG RE SUPP: 10.0000 mg | Freq: Every day | RECTAL | Status: DC | PRN

## 2017-04-10 MED ORDER — CEFAZOLIN SODIUM-DEXTROSE 2-3 GM-%(50ML) IV SOLR
INTRAVENOUS | Status: DC | PRN
  Administered 2017-04-10: 2 g via INTRAVENOUS

## 2017-04-10 MED ORDER — ALBUTEROL SULFATE (2.5 MG/3ML) 0.083% IN NEBU
INHALATION_SOLUTION | RESPIRATORY_TRACT | Status: AC
  Administered 2017-04-10: 2.5 mg via RESPIRATORY_TRACT
  Filled 2017-04-10: qty 3

## 2017-04-10 MED ORDER — GABAPENTIN 300 MG PO CAPS
600.0000 mg | ORAL_CAPSULE | Freq: Three times a day (TID) | ORAL | Status: DC
  Administered 2017-04-11 (×4): 600 mg via ORAL
  Filled 2017-04-10 (×5): qty 2

## 2017-04-10 MED ORDER — CEFAZOLIN SODIUM-DEXTROSE 2-4 GM/100ML-% IV SOLN
INTRAVENOUS | Status: AC
  Filled 2017-04-10: qty 100

## 2017-04-10 MED ORDER — HYDROMORPHONE HCL 1 MG/ML IJ SOLN
0.2500 mg | INTRAMUSCULAR | Status: DC | PRN
  Administered 2017-04-10 (×2): 0.5 mg via INTRAVENOUS

## 2017-04-10 MED ORDER — ZOLPIDEM TARTRATE 10 MG PO TABS: 10.0000 mg | ORAL_TABLET | Freq: Every evening | ORAL | Status: DC | PRN

## 2017-04-10 MED ORDER — METHOCARBAMOL 500 MG PO TABS
ORAL_TABLET | ORAL | Status: AC
  Filled 2017-04-10: qty 1

## 2017-04-10 MED ORDER — HYDROMORPHONE HCL 1 MG/ML IJ SOLN: 0.5000 mg | INTRAMUSCULAR | Status: DC | PRN

## 2017-04-10 MED ORDER — POLYSACCHARIDE IRON COMPLEX 150 MG PO CAPS
150.0000 mg | ORAL_CAPSULE | Freq: Two times a day (BID) | ORAL | Status: DC
  Administered 2017-04-11 – 2017-04-15 (×9): 150 mg via ORAL
  Filled 2017-04-10 (×9): qty 1

## 2017-04-10 MED ORDER — ONDANSETRON HCL 4 MG PO TABS: 4.0000 mg | ORAL_TABLET | Freq: Four times a day (QID) | ORAL | Status: DC | PRN

## 2017-04-10 MED ORDER — DOCUSATE SODIUM 100 MG PO CAPS
100.0000 mg | ORAL_CAPSULE | Freq: Two times a day (BID) | ORAL | Status: DC
  Administered 2017-04-10 – 2017-04-14 (×8): 100 mg via ORAL
  Filled 2017-04-10 (×9): qty 1

## 2017-04-10 MED ORDER — MONTELUKAST SODIUM 10 MG PO TABS
10.0000 mg | ORAL_TABLET | Freq: Every day | ORAL | Status: DC
  Administered 2017-04-11 – 2017-04-14 (×5): 10 mg via ORAL
  Filled 2017-04-10 (×5): qty 1

## 2017-04-10 MED ORDER — METOCLOPRAMIDE HCL 5 MG/ML IJ SOLN: 5.0000 mg | Freq: Three times a day (TID) | INTRAMUSCULAR | Status: DC | PRN

## 2017-04-10 MED ORDER — TAMSULOSIN HCL 0.4 MG PO CAPS
0.4000 mg | ORAL_CAPSULE | Freq: Every day | ORAL | Status: DC
  Administered 2017-04-11 – 2017-04-14 (×5): 0.4 mg via ORAL
  Filled 2017-04-10 (×5): qty 1

## 2017-04-10 MED ORDER — METOCLOPRAMIDE HCL 5 MG PO TABS: 5.0000 mg | ORAL_TABLET | Freq: Three times a day (TID) | ORAL | Status: DC | PRN

## 2017-04-10 MED ORDER — FUROSEMIDE 20 MG PO TABS
20.0000 mg | ORAL_TABLET | Freq: Every day | ORAL | Status: DC
  Administered 2017-04-11: 20 mg via ORAL
  Filled 2017-04-10: qty 1

## 2017-04-10 MED ORDER — PHENYLEPHRINE 40 MCG/ML (10ML) SYRINGE FOR IV PUSH (FOR BLOOD PRESSURE SUPPORT)
PREFILLED_SYRINGE | INTRAVENOUS | Status: DC | PRN
  Administered 2017-04-10: 80 ug via INTRAVENOUS

## 2017-04-10 MED ORDER — IPRATROPIUM-ALBUTEROL 0.5-2.5 (3) MG/3ML IN SOLN
3.0000 mL | Freq: Once | RESPIRATORY_TRACT | Status: AC
  Administered 2017-04-10: 3 mL via RESPIRATORY_TRACT
  Filled 2017-04-10: qty 3

## 2017-04-10 MED ORDER — DEXAMETHASONE SODIUM PHOSPHATE 10 MG/ML IJ SOLN
INTRAMUSCULAR | Status: DC | PRN
  Administered 2017-04-10: 5 mg via INTRAVENOUS

## 2017-04-10 MED ORDER — ASPIRIN EC 325 MG PO TBEC
325.0000 mg | DELAYED_RELEASE_TABLET | Freq: Every day | ORAL | Status: DC
  Administered 2017-04-11 – 2017-04-15 (×5): 325 mg via ORAL
  Filled 2017-04-10 (×5): qty 1

## 2017-04-10 MED ORDER — SUGAMMADEX SODIUM 500 MG/5ML IV SOLN
INTRAVENOUS | Status: DC | PRN
  Administered 2017-04-10: 400 mg via INTRAVENOUS

## 2017-04-10 MED ORDER — LISINOPRIL 40 MG PO TABS: 40.0000 mg | ORAL_TABLET | Freq: Every day | ORAL | Status: DC

## 2017-04-10 MED ORDER — ONDANSETRON HCL 4 MG/2ML IJ SOLN
INTRAMUSCULAR | Status: DC | PRN
  Administered 2017-04-10: 4 mg via INTRAVENOUS

## 2017-04-10 MED ORDER — ALBUTEROL SULFATE (2.5 MG/3ML) 0.083% IN NEBU
2.5000 mg | INHALATION_SOLUTION | RESPIRATORY_TRACT | Status: DC
  Administered 2017-04-10 – 2017-04-11 (×2): 2.5 mg via RESPIRATORY_TRACT
  Filled 2017-04-10 (×2): qty 3

## 2017-04-10 MED ORDER — MAGNESIUM CITRATE PO SOLN: 1.0000 | Freq: Once | ORAL | Status: DC | PRN

## 2017-04-10 MED ORDER — PROPOFOL 10 MG/ML IV BOLUS
INTRAVENOUS | Status: DC | PRN
  Administered 2017-04-10: 100 mg via INTRAVENOUS

## 2017-04-10 MED ORDER — CARVEDILOL 12.5 MG PO TABS
12.5000 mg | ORAL_TABLET | Freq: Two times a day (BID) | ORAL | Status: DC
  Administered 2017-04-11 – 2017-04-15 (×9): 12.5 mg via ORAL
  Filled 2017-04-10 (×9): qty 1

## 2017-04-10 MED ORDER — TIZANIDINE HCL 4 MG PO TABS
4.0000 mg | ORAL_TABLET | Freq: Three times a day (TID) | ORAL | Status: DC
  Administered 2017-04-11 (×3): 4 mg via ORAL
  Filled 2017-04-10 (×3): qty 1

## 2017-04-10 MED ORDER — LIDOCAINE 2% (20 MG/ML) 5 ML SYRINGE
INTRAMUSCULAR | Status: DC | PRN
  Administered 2017-04-10: 60 mg via INTRAVENOUS
  Administered 2017-04-10: 40 mg via INTRAVENOUS

## 2017-04-10 MED ORDER — SODIUM CHLORIDE 0.9 % IR SOLN
Status: DC | PRN
  Administered 2017-04-10: 9000 mL

## 2017-04-10 MED ORDER — ACETAMINOPHEN 500 MG PO TABS
1000.0000 mg | ORAL_TABLET | Freq: Four times a day (QID) | ORAL | Status: AC
  Administered 2017-04-10 – 2017-04-11 (×4): 1000 mg via ORAL
  Filled 2017-04-10 (×4): qty 2

## 2017-04-10 MED ORDER — OXYCODONE HCL 5 MG PO TABS
5.0000 mg | ORAL_TABLET | ORAL | Status: DC | PRN
  Administered 2017-04-10 – 2017-04-15 (×7): 10 mg via ORAL
  Filled 2017-04-10 (×5): qty 2
  Filled 2017-04-10: qty 1
  Filled 2017-04-10 (×2): qty 2

## 2017-04-10 MED ORDER — ZOLPIDEM TARTRATE 5 MG PO TABS: 5.0000 mg | ORAL_TABLET | Freq: Every evening | ORAL | Status: DC | PRN

## 2017-04-10 MED ORDER — PANTOPRAZOLE SODIUM 40 MG PO TBEC
80.0000 mg | DELAYED_RELEASE_TABLET | Freq: Every day | ORAL | Status: DC
  Administered 2017-04-11: 80 mg via ORAL
  Filled 2017-04-10: qty 2

## 2017-04-10 MED ORDER — POTASSIUM CHLORIDE IN NACL 20-0.45 MEQ/L-% IV SOLN
INTRAVENOUS | Status: DC
  Administered 2017-04-10: 18:00:00 via INTRAVENOUS
  Filled 2017-04-10 (×2): qty 1000

## 2017-04-10 MED ORDER — MOMETASONE FURO-FORMOTEROL FUM 200-5 MCG/ACT IN AERO
2.0000 | INHALATION_SPRAY | Freq: Two times a day (BID) | RESPIRATORY_TRACT | Status: DC
  Administered 2017-04-11 – 2017-04-15 (×9): 2 via RESPIRATORY_TRACT
  Filled 2017-04-10: qty 8.8

## 2017-04-10 MED ORDER — DIPHENHYDRAMINE HCL 12.5 MG/5ML PO ELIX: 12.5000 mg | ORAL_SOLUTION | ORAL | Status: DC | PRN

## 2017-04-10 SURGICAL SUPPLY — 70 items
BANDAGE ACE 4X5 VEL STRL LF (GAUZE/BANDAGES/DRESSINGS) ×2 IMPLANT
BANDAGE ACE 6X5 VEL STRL LF (GAUZE/BANDAGES/DRESSINGS) ×2 IMPLANT
BANDAGE ESMARK 6X9 LF (GAUZE/BANDAGES/DRESSINGS) IMPLANT
BIT DRILL 3.8X6 NS (BIT) ×2 IMPLANT
BIT DRILL 4.4 NS (BIT) ×2 IMPLANT
BLADE CLIPPER SURG (BLADE) IMPLANT
BLADE SURG 15 STRL LF DISP TIS (BLADE) ×1 IMPLANT
BLADE SURG 15 STRL SS (BLADE) ×2
BNDG COHESIVE 6X5 TAN STRL LF (GAUZE/BANDAGES/DRESSINGS) ×2 IMPLANT
BNDG ESMARK 6X9 LF (GAUZE/BANDAGES/DRESSINGS)
BNDG GAUZE ELAST 4 BULKY (GAUZE/BANDAGES/DRESSINGS) ×2 IMPLANT
BOOTCOVER CLEANROOM LRG (PROTECTIVE WEAR) ×4 IMPLANT
COVER SURGICAL LIGHT HANDLE (MISCELLANEOUS) ×4 IMPLANT
CUFF TOURNIQUET SINGLE 34IN LL (TOURNIQUET CUFF) IMPLANT
CUFF TOURNIQUET SINGLE 44IN (TOURNIQUET CUFF) IMPLANT
DRAPE C-ARM 42X72 X-RAY (DRAPES) ×2 IMPLANT
DRAPE C-ARMOR (DRAPES) ×2 IMPLANT
DRAPE HALF SHEET 40X57 (DRAPES) ×4 IMPLANT
DRAPE IMP U-DRAPE 54X76 (DRAPES) ×2 IMPLANT
DRAPE ORTHO SPLIT 77X108 STRL (DRAPES) ×6
DRAPE SURG ORHT 6 SPLT 77X108 (DRAPES) ×3 IMPLANT
DRAPE U-SHAPE 47X51 STRL (DRAPES) ×2 IMPLANT
DRESSING ADAPTIC 1/2  N-ADH (PACKING) ×2 IMPLANT
DRSG ADAPTIC 3X8 NADH LF (GAUZE/BANDAGES/DRESSINGS) ×2 IMPLANT
DURAPREP 26ML APPLICATOR (WOUND CARE) ×2 IMPLANT
ELECT REM PT RETURN 9FT ADLT (ELECTROSURGICAL) ×2
ELECTRODE REM PT RTRN 9FT ADLT (ELECTROSURGICAL) ×1 IMPLANT
FACESHIELD WRAPAROUND (MASK) IMPLANT
GAUZE SPONGE 4X4 12PLY STRL (GAUZE/BANDAGES/DRESSINGS) ×4 IMPLANT
GAUZE SPONGE 4X4 16PLY XRAY LF (GAUZE/BANDAGES/DRESSINGS) ×2 IMPLANT
GLOVE BIOGEL PI ORTHO PRO SZ8 (GLOVE) ×1
GLOVE ORTHO TXT STRL SZ7.5 (GLOVE) ×2 IMPLANT
GLOVE PI ORTHO PRO STRL SZ8 (GLOVE) ×1 IMPLANT
GLOVE SURG ORTHO 8.0 STRL STRW (GLOVE) ×4 IMPLANT
GOWN STRL REUS W/ TWL LRG LVL3 (GOWN DISPOSABLE) IMPLANT
GOWN STRL REUS W/TWL LRG LVL3 (GOWN DISPOSABLE)
GUIDEWIRE BALL NOSE 80CM (WIRE) ×2 IMPLANT
KIT BASIN OR (CUSTOM PROCEDURE TRAY) ×2 IMPLANT
KIT ROOM TURNOVER OR (KITS) ×2 IMPLANT
MANIFOLD NEPTUNE II (INSTRUMENTS) ×2 IMPLANT
NAIL TIBIAL 10MMX33CM (Nail) ×2 IMPLANT
NS IRRIG 1000ML POUR BTL (IV SOLUTION) ×2 IMPLANT
PACK GENERAL/GYN (CUSTOM PROCEDURE TRAY) ×2 IMPLANT
PACK UNIVERSAL I (CUSTOM PROCEDURE TRAY) ×2 IMPLANT
PAD ABD 8X10 STRL (GAUZE/BANDAGES/DRESSINGS) ×6 IMPLANT
PAD ARMBOARD 7.5X6 YLW CONV (MISCELLANEOUS) ×4 IMPLANT
PADDING CAST SYNTHETIC 4 (CAST SUPPLIES) ×2
PADDING CAST SYNTHETIC 4X4 STR (CAST SUPPLIES) ×2 IMPLANT
PIN GUIDE 3.2X14 1401214 (MISCELLANEOUS) ×2 IMPLANT
SCREW ACECAP 40MM (Screw) ×2 IMPLANT
SCREW ACECAP 42MM (Screw) ×2 IMPLANT
SCREW ACECAP 48MM (Screw) ×2 IMPLANT
SCREW ACECAP 56MM (Screw) ×2 IMPLANT
SCREW PROXIMAL DEPUY (Screw) ×2 IMPLANT
SCREW PRXML FT 65X5.5XNS CORT (Screw) ×1 IMPLANT
SPLINT PLASTER CAST XFAST 5X30 (CAST SUPPLIES) ×1 IMPLANT
SPLINT PLASTER XFAST SET 5X30 (CAST SUPPLIES) ×1
STAPLER VISISTAT 35W (STAPLE) ×2 IMPLANT
STOCKINETTE IMPERVIOUS LG (DRAPES) ×2 IMPLANT
SUCTION FRAZIER TIP 10 FR DISP (SUCTIONS) ×2 IMPLANT
SUT VIC AB 0 CT1 18XCR BRD 8 (SUTURE) ×1 IMPLANT
SUT VIC AB 0 CT1 8-18 (SUTURE) ×2
SUT VIC AB 2-0 CT1 27 (SUTURE) ×2
SUT VIC AB 2-0 CT1 TAPERPNT 27 (SUTURE) ×1 IMPLANT
SUT VIC AB 3-0 SH 8-18 (SUTURE) ×2 IMPLANT
TAPE STRIPS DRAPE STRL (GAUZE/BANDAGES/DRESSINGS) ×2 IMPLANT
TOWEL OR 17X24 6PK STRL BLUE (TOWEL DISPOSABLE) ×2 IMPLANT
TOWEL OR 17X26 10 PK STRL BLUE (TOWEL DISPOSABLE) ×2 IMPLANT
TRAY FOLEY W/METER SILVER 16FR (SET/KITS/TRAYS/PACK) IMPLANT
WATER STERILE IRR 1000ML POUR (IV SOLUTION) ×2 IMPLANT

## 2017-04-10 NOTE — Anesthesia Preprocedure Evaluation (Addendum)
Anesthesia Evaluation  Patient identified by MRN, date of birth, ID band Patient awake    Reviewed: Allergy & Precautions, H&P , NPO status , Patient's Chart, lab work & pertinent test results, reviewed documented beta blocker date and time   Airway Mallampati: II  TM Distance: >3 FB Neck ROM: Full    Dental no notable dental hx. (+) Edentulous Upper, Edentulous Lower, Dental Advisory Given   Pulmonary sleep apnea and Oxygen sleep apnea , COPD,  COPD inhaler and oxygen dependent, Current Smoker,     + wheezing      Cardiovascular hypertension, Pt. on medications and Pt. on home beta blockers + Peripheral Vascular Disease   Rhythm:Regular Rate:Normal     Neuro/Psych PSYCHIATRIC DISORDERS Depression negative neurological ROS     GI/Hepatic Neg liver ROS, GERD  Medicated and Controlled,  Endo/Other  diabetes, Type 2, Oral Hypoglycemic Agents, Insulin DependentMorbid obesity  Renal/GU negative Renal ROS  negative genitourinary   Musculoskeletal   Abdominal   Peds  Hematology negative hematology ROS (+) anemia ,   Anesthesia Other Findings   Reproductive/Obstetrics negative OB ROS                           Anesthesia Physical Anesthesia Plan  ASA: III and emergent  Anesthesia Plan: General   Post-op Pain Management:    Induction: Intravenous  PONV Risk Score and Plan: 3 and Ondansetron, Dexamethasone and Midazolam  Airway Management Planned: Oral ETT  Additional Equipment:   Intra-op Plan:   Post-operative Plan: Extubation in OR  Informed Consent: I have reviewed the patients History and Physical, chart, labs and discussed the procedure including the risks, benefits and alternatives for the proposed anesthesia with the patient or authorized representative who has indicated his/her understanding and acceptance.   Dental advisory given  Plan Discussed with: CRNA  Anesthesia Plan  Comments:        Anesthesia Quick Evaluation
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
## 2017-04-10 NOTE — Anesthesia Procedure Notes (Signed)
Procedure Name: Intubation Date/Time: 04/10/2017 11:38 AM Performed by: Valda Favia, CRNA Pre-anesthesia Checklist: Patient identified, Emergency Drugs available, Suction available and Patient being monitored Patient Re-evaluated:Patient Re-evaluated prior to induction Oxygen Delivery Method: Circle System Utilized Preoxygenation: Pre-oxygenation with 100% oxygen Induction Type: IV induction Ventilation: Mask ventilation without difficulty and Oral airway inserted - appropriate to patient size Laryngoscope Size: Mac and 4 Grade View: Grade I Tube type: Oral Tube size: 7.5 mm Number of attempts: 1 Airway Equipment and Method: Stylet and Oral airway Placement Confirmation: ETT inserted through vocal cords under direct vision,  positive ETCO2 and breath sounds checked- equal and bilateral Secured at: 20 cm Tube secured with: Tape Dental Injury: Teeth and Oropharynx as per pre-operative assessment

## 2017-04-10 NOTE — Transfer of Care (Signed)
Immediate Anesthesia Transfer of Care Note  Patient: Paul [name]  Procedure(s) Performed: INTRAMEDULLARY (IM) NAIL TIBIAL AND IRRIGATION AND DEBRIDEMENT (Left Leg Upper)  Patient Location: PACU  Anesthesia Type:General  Level of Consciousness: awake and alert   Airway & Oxygen Therapy: Patient Spontanous Breathing and Patient connected to face mask oxygen  Post-op Assessment: Report given to RN and Post -op Vital signs reviewed and stable  Post vital signs: Reviewed and stable  Last Vitals: There were no vitals filed for this visit.  Last Pain: There were no vitals filed for this visit.       Complications: No apparent anesthesia complications

## 2017-04-10 NOTE — Progress Notes (Signed)
Xrays complete

## 2017-04-10 NOTE — Anesthesia Postprocedure Evaluation (Signed)
Anesthesia Post Note  Patient: ACESON LABELL  Procedure(s) Performed: INTRAMEDULLARY (IM) NAIL TIBIAL AND IRRIGATION AND DEBRIDEMENT (Left Leg Upper)     Patient location during evaluation: PACU Anesthesia Type: General Level of consciousness: awake and alert Pain management: pain level controlled Vital Signs Assessment: post-procedure vital signs reviewed and stable Respiratory status: spontaneous breathing, nonlabored ventilation, respiratory function stable and patient connected to nasal cannula oxygen Cardiovascular status: blood pressure returned to baseline and stable Postop Assessment: no apparent nausea or vomiting Anesthetic complications: no    Last Vitals:  Vitals:   04/10/17 1631 04/10/17 1648  BP: (!) 121/55 121/62  Pulse: 88 88  Resp: 13 18  Temp: (!) 36.3 C 36.9 C  SpO2: 95% 93%    Last Pain:  Vitals:   04/10/17 1648  TempSrc: Oral  PainSc:                  Glen Kesinger,W. EDMOND

## 2017-04-10 NOTE — Discharge Instructions (Signed)
Diet: As you were doing prior to hospitalization   Shower:  May shower but keep the wounds dry, use an occlusive plastic wrap, NO SOAKING IN TUB.  If the bandage gets wet, change with a clean dry gauze.  If you have a splint on, leave the splint in place and keep the splint dry with a plastic bag.  Dressing:  You may change your dressing 3-5 days after surgery, unless you have a splint.  If you have a splint, then just leave the splint in place and we will change your bandages during your first follow-up appointment.    If you had hand or foot surgery, we will plan to remove your stitches in about 2 weeks in the office.  For all other surgeries, there are sticky tapes (steri-strips) on your wounds and all the stitches are absorbable.  Leave the steri-strips in place when changing your dressings, they will peel off with time, usually 2-3 weeks.  Activity:  Increase activity slowly as tolerated, but follow the weight bearing instructions below.  The rules on driving is that you can not be taking narcotics while you drive, and you must feel in control of the vehicle.    Weight Bearing:   Non weight on left leg.    To prevent constipation: you may use a stool softener such as -  Colace (over the counter) 100 mg by mouth twice a day  Drink plenty of fluids (prune juice may be helpful) and high fiber foods Miralax (over the counter) for constipation as needed.    Itching:  If you experience itching with your medications, try taking only a single pain pill, or even half a pain pill at a time.  You may take up to 10 pain pills per day, and you can also use benadryl over the counter for itching or also to help with sleep.   Precautions:  If you experience chest pain or shortness of breath - call 911 immediately for transfer to the hospital emergency department!!  If you develop a fever greater that 101 F, purulent drainage from wound, increased redness or drainage from wound, or calf pain -- Call the  office at [phone]                                                Follow- Up Appointment:  Please call for an appointment to be seen in 2 weeks [place] - [phone]

## 2017-04-10 NOTE — Op Note (Signed)
04/10/2017  2:13 PM  PATIENT:  Patience Musca    PRE-OPERATIVE DIAGNOSIS: Open left grade 2 distal tibia fracture, segmental  POST-OPERATIVE DIAGNOSIS:  Same  PROCEDURE:   Left tibia incision, irrigation, debridement, of skin, subcutaneous tissue, and bone associated with open fracture, using skin knife, curette, Cobb elevator, and a Metzenbaum scissors.  Intramedullary nail fixation, left tibia  SURGEON:  Johnny Bridge, MD  PHYSICIAN ASSISTANT: Joya Gaskins, OPA-C, present and scrubbed throughout the case, critical for completion in a timely fashion, and for retraction, instrumentation, and closure.  ANESTHESIA:   General  PREOPERATIVE INDICATIONS:  GIANFRANCO ARAKI is a  62 y.o. male with a diagnosis of tibial fracture who elected for surgical management.  He has significant risk factors including history of tobacco abuse, stroke, and multiple other comorbidities including ankle fracture below.  The risks benefits and alternatives were discussed with the patient preoperatively including but not limited to the risks of infection, bleeding, nerve injury, cardiopulmonary complications, the need for revision surgery, among others, and the patient was willing to proceed.  We also discussed the risks for hardware prominence, hardware failure, and the need for revision surgery, malunion, nonunion.  I counseled him this quit smoking.  ESTIMATED BLOOD LOSS: 150 mL  OPERATIVE IMPLANTS: Biomet tibial nail size 10 x 330 with a total of 1 proximal interlocking bolts, and 3 distal interlocking bolts.  OPERATIVE FINDINGS: Comminuted segmental distal tibia fracture.  This was grade 2.  There was periosteal stripping and a large segment of medial spike of bone from the proximal segment that exited medially.  OPERATIVE PROCEDURE: The patient was brought to the operating room and placed in the supine position. Gen. anesthesia was administered. The lower extremity was prepped and draped in usual  sterile fashion. Time out was performed.  We did a pre-scrub on his foot and ankle because it was filthy.  After prep and drape with Betadine, I used a single instrument set for the incision, irrigation, and debridement, extending at the traumatic wound which was about 2 cm in length, extending it by 2 cm on either side, exposing the bony shards, removing any gross debris although it was not severely contaminated.  I irrigated a total of 9 L of fluid across the fracture site both proximal and distal, removed any devitalized tissue, and then changed instruments and gloves, and proceeded with the remainder of the case.  I did use a long 0, a curette, a Cobb elevator, a Metzenbaum scissors, and a knife for the surgical debridement of the open fracture.  Anterior patellar tendon incision was performed and the proximal tibia exposed. The knee was hyperflexed, and a guidewire introduced into the appropriate position and confirmed on fluoroscopy on both AP and lateral views.  Bone quality was extremely poor, I actually placed the guidewire in by hand.  I opened the proximal tibia with the appropriate reamer, and then placed a ball-tipped guidewire down across the fracture site reducing it anatomically as much as possible, although it was segmental and somewhat difficult to completely anatomically reduced, but I did have a key from the medial segment..  I confirmed position on AP and lateral views across the entire length of the tibia, and then reamed sequentially to 1.5 mm above the nail size.  The nail was measured in length, selected, opened, assembled, and then delivered down the tibia across the fracture site. Appropriate alignment confirmed on AP and lateral views. The length was also confirmed.  I then secured  the nail with a proximal interlocking bolt, and also used perfect circles technique to secure the nail distally with interlocking bolts.  I placed a total of 3 distal interlocking bolts, just barely  missing the pre-existing medial malleolus screws.  I took care to confirm fracture apposition as well as rotational alignment clinically and radiographically prior to securing the distal segment.  Final C-arm pictures were taken, the wounds were irrigated copiously, there were also injected with Marcaine, and the patellar tendon split repaired with Vicryl followed by Vicryl and Monocryl for the subcutaneous tissues. Steri-Strips and sterile gauze was applied followed by a posterior splint. The patient was awakened and returned to the PACU in stable and satisfactory condition. There were no complications.

## 2017-04-11 ENCOUNTER — Encounter (HOSPITAL_COMMUNITY): Payer: Self-pay | Admitting: Orthopedic Surgery

## 2017-04-11 LAB — COMPREHENSIVE METABOLIC PANEL
ALT: 12 U/L — ABNORMAL LOW (ref 17–63)
AST: 27 U/L (ref 15–41)
Albumin: 3.2 g/dL — ABNORMAL LOW (ref 3.5–5.0)
Alkaline Phosphatase: 91 U/L (ref 38–126)
Anion gap: 13 (ref 5–15)
BUN: 23 mg/dL — ABNORMAL HIGH (ref 6–20)
CO2: 27 mmol/L (ref 22–32)
Calcium: 8.1 mg/dL — ABNORMAL LOW (ref 8.9–10.3)
Chloride: 95 mmol/L — ABNORMAL LOW (ref 101–111)
Creatinine, Ser: 3.03 mg/dL — ABNORMAL HIGH (ref 0.61–1.24)
GFR calc Af Amer: 24 mL/min — ABNORMAL LOW (ref >60)
GFR calc non Af Amer: 20 mL/min — ABNORMAL LOW (ref >60)
Glucose, Bld: 110 mg/dL — ABNORMAL HIGH (ref 65–99)
Potassium: 4.3 mmol/L (ref 3.5–5.1)
Sodium: 135 mmol/L (ref 135–145)
Total Bilirubin: 0.7 mg/dL (ref 0.3–1.2)
Total Protein: 6.1 g/dL — ABNORMAL LOW (ref 6.5–8.1)

## 2017-04-11 LAB — CBC
HCT: 30.6 % — ABNORMAL LOW (ref 39.0–52.0)
Hemoglobin: 9.4 g/dL — ABNORMAL LOW (ref 13.0–17.0)
MCH: 26 pg (ref 26.0–34.0)
MCHC: 30.7 g/dL (ref 30.0–36.0)
MCV: 84.8 fL (ref 78.0–100.0)
Platelets: 169 10*3/uL (ref 150–400)
RBC: 3.61 MIL/uL — ABNORMAL LOW (ref 4.22–5.81)
RDW: 16.8 % — ABNORMAL HIGH (ref 11.5–15.5)
WBC: 12.7 10*3/uL — ABNORMAL HIGH (ref 4.0–10.5)

## 2017-04-11 LAB — GLUCOSE, CAPILLARY
Glucose-Capillary: 123 mg/dL — ABNORMAL HIGH (ref 65–99)
Glucose-Capillary: 118 mg/dL — ABNORMAL HIGH (ref 65–99)
Glucose-Capillary: 164 mg/dL — ABNORMAL HIGH (ref 65–99)

## 2017-04-11 MED ORDER — CEFAZOLIN SODIUM-DEXTROSE 1-4 GM/50ML-% IV SOLN
1.0000 g | Freq: Three times a day (TID) | INTRAVENOUS | Status: DC
  Administered 2017-04-11 (×3): 1 g via INTRAVENOUS
  Filled 2017-04-11 (×4): qty 50

## 2017-04-11 MED ORDER — GABAPENTIN 300 MG PO CAPS
600.0000 mg | ORAL_CAPSULE | Freq: Two times a day (BID) | ORAL | Status: DC
  Administered 2017-04-12 – 2017-04-15 (×7): 600 mg via ORAL
  Filled 2017-04-11 (×7): qty 2

## 2017-04-11 MED ORDER — INSULIN ASPART 100 UNIT/ML ~~LOC~~ SOLN
0.0000 [IU] | Freq: Three times a day (TID) | SUBCUTANEOUS | Status: DC
  Administered 2017-04-12 – 2017-04-13 (×2): 2 [IU] via SUBCUTANEOUS
  Administered 2017-04-13 – 2017-04-14 (×3): 3 [IU] via SUBCUTANEOUS
  Administered 2017-04-14: 2 [IU] via SUBCUTANEOUS
  Administered 2017-04-15: 3 [IU] via SUBCUTANEOUS
  Administered 2017-04-15: 2 [IU] via SUBCUTANEOUS

## 2017-04-11 MED ORDER — ALBUTEROL SULFATE (2.5 MG/3ML) 0.083% IN NEBU: 2.5000 mg | INHALATION_SOLUTION | Freq: Four times a day (QID) | RESPIRATORY_TRACT | Status: DC | PRN

## 2017-04-11 MED ORDER — CLOPIDOGREL BISULFATE 75 MG PO TABS
75.0000 mg | ORAL_TABLET | Freq: Every day | ORAL | Status: DC
  Administered 2017-04-12 – 2017-04-15 (×4): 75 mg via ORAL
  Filled 2017-04-11 (×4): qty 1

## 2017-04-11 MED ORDER — ALBUTEROL SULFATE (2.5 MG/3ML) 0.083% IN NEBU
2.5000 mg | INHALATION_SOLUTION | Freq: Three times a day (TID) | RESPIRATORY_TRACT | Status: DC
  Administered 2017-04-11 – 2017-04-15 (×14): 2.5 mg via RESPIRATORY_TRACT
  Filled 2017-04-11 (×13): qty 3

## 2017-04-11 NOTE — Evaluation (Signed)
Physical Therapy Evaluation Patient Details Name: Paul [name] MRN: [id number] DOB: 20-Sep-1955 Today's Date: 04/11/2017   History of Present Illness  Pt. is a 62 y.o. M with significant PMH of stroke, neuropathy, and is a current smoker. Patient admitted following a fall resulting in a left tibial fracture s/p IM nail procedure.    Clinical Impression  Pt admitted with above diagnosis. Pt currently with functional limitations due to the deficits listed below (see PT Problem List). At the time of PT eval, pt only able to transfer from bed to chair due to difficulty maintaining nonweightbearing precautions with ambulation and desatting to 76% SpO2 on 4L O2. Patient returned to 90% SpO2 following ~2 minutes in the seated position. Pt states that he will have 24/7 assistance at home between his 8 children, but his sister states that it is difficult to coordinate family members to supervise. PT recommended 24/7 assistance at this time due to patient decreased safety awareness, history of falls, and current deficits. Pt will benefit from skilled PT to increase their independence and safety with mobility to allow discharge to the venue listed below.    SATURATION QUALIFICATIONS: (This note is used to comply with regulatory documentation for home oxygen)  Patient Saturations on Room Air at Rest = 87%  Patient Saturations on Room Air while Ambulating = *% (not tested)  Patient Saturations on 4 Liters of oxygen while Ambulating = 78%  Please briefly explain why patient needs home oxygen: Patient is unable to obtain oxygen saturations above 90% SpO2 on room air at rest.      Follow Up Recommendations Home health PT;Supervision/Assistance - 24 hour    Equipment Recommendations  Rolling walker with 5" wheels;Wheelchair (measurements PT);Wheelchair cushion (measurements PT)    Recommendations for Other Services       Precautions / Restrictions Precautions Precautions: Fall Restrictions Weight  Bearing Restrictions: Yes LLE Weight Bearing: Non weight bearing      Mobility  Bed Mobility Overal bed mobility: Needs Assistance Bed Mobility: Supine to Sit     Supine to sit: Supervision;HOB elevated        Transfers Overall transfer level: Needs assistance Equipment used: Rolling walker (2 wheeled) Transfers: Sit to/from Stand Sit to Stand: Min guard;+2 safety/equipment            Ambulation/Gait Ambulation/Gait assistance: Min guard;+2 safety/equipment Ambulation Distance (Feet): 3 Feet Assistive device: Rolling walker (2 wheeled)       General Gait Details: Patient requiring min guard + 2 for safety for taking steps from bed to chair with RW. Unable to ambulate further due to patient inability to maintain NWB precaution.  Stairs            Wheelchair Mobility    Modified Rankin (Stroke Patients Only)       Balance Overall balance assessment: Needs assistance Sitting-balance support: No upper extremity supported;Feet supported Sitting balance-Leahy Scale: Fair     Standing balance support: Bilateral upper extremity supported Standing balance-Leahy Scale: Poor Standing balance comment: BUE support required on RW                             Pertinent Vitals/Pain Pain Assessment: No/denies pain    Home Living Family/patient expects to be discharged to:: Private residence Living Arrangements: Alone Available Help at Discharge: Family Type of Home: House Home Access: Stairs to enter   Technical brewer of Steps: 1 Home Layout: One level Home Equipment: Environmental consultant -  4 wheels;Transport chair      Prior Function Level of Independence: Needs assistance   Gait / Transfers Assistance Needed: Independent with household ambulation with no device; needs physical assistance for safety with community ambulation  ADL's / Homemaking Assistance Needed: Independent with ADL's        Hand Dominance        Extremity/Trunk  Assessment   Upper Extremity Assessment Upper Extremity Assessment: Defer to OT evaluation    Lower Extremity Assessment Lower Extremity Assessment: Generalized weakness       Communication   Communication: HOH  Cognition Arousal/Alertness: Awake/alert Behavior During Therapy: WFL for tasks assessed/performed Overall Cognitive Status: Impaired/Different from baseline Area of Impairment: Memory;Safety/judgement                     Memory: Decreased short-term memory   Safety/Judgement: Decreased awareness of safety     General Comments: Needs redirection and simple commands to orient to task      General Comments General comments (skin integrity, edema, etc.): Patient sister present throughout session.     Exercises     Assessment/Plan    PT Assessment Patient needs continued PT services  PT Problem List Decreased strength;Decreased balance;Decreased mobility;Decreased knowledge of use of DME;Decreased safety awareness       PT Treatment Interventions DME instruction;Gait training;Stair training;Functional mobility training;Therapeutic activities;Patient/family education;Wheelchair mobility training;Therapeutic exercise;Balance training    PT Goals (Current goals can be found in the Care Plan section)  Acute Rehab PT Goals Patient Stated Goal: Go home PT Goal Formulation: With patient Time For Goal Achievement: 04/25/17 Potential to Achieve Goals: Good    Frequency Min 5X/week   Barriers to discharge Decreased caregiver support      Co-evaluation               AM-PAC PT "6 Clicks" Daily Activity  Outcome Measure Difficulty turning over in bed (including adjusting bedclothes, sheets and blankets)?: A Little Difficulty moving from lying on back to sitting on the side of the bed? : A Lot Difficulty sitting down on and standing up from a chair with arms (e.g., wheelchair, bedside commode, etc,.)?: A Lot Help needed moving to and from a bed to  chair (including a wheelchair)?: A Little Help needed walking in hospital room?: A Little Help needed climbing 3-5 steps with a railing? : A Lot 6 Click Score: 15    End of Session Equipment Utilized During Treatment: Gait belt Activity Tolerance: Patient tolerated treatment well Patient left: in chair;with call bell/phone within reach;with family/visitor present Nurse Communication: Mobility status PT Visit Diagnosis: Repeated falls (R29.6);Difficulty in walking, not elsewhere classified (R26.2)    Time: 7989-2119 PT Time Calculation (min) (ACUTE ONLY): 28 min   Charges:   PT Evaluation $PT Eval Moderate Complexity: 1 Mod PT Treatments $Therapeutic Activity: 8-22 mins   PT G Codes:        Ellamae Sia, PT, DPT Acute Rehabilitation Services  Willy Eddy 04/11/2017, 1:34 PM

## 2017-04-11 NOTE — Care Management Note (Signed)
Case Management Note  Patient Details  Name: BRISON FIUMARA MRN: [id number] Date of Birth: 25-Aug-1955  Subjective/Objective:  62 yr old male s/p IM Nailing of left Tib/fib fracture.                  Action/Plan: Case manager spoke with patient concerning discharge plan. He says he has been to SNF's before and he prefers to go home. Choice for Home Health agency was offered, referral was called to [facility], [facility]. Patient says he will have support at discharge, he has 8 children and they will assist him as well as his sister and ex-wife.    Expected Discharge Date:   pending               Expected Discharge Plan:  [place]  In-House Referral:  NA  Discharge planning Services  CM Consult  Post Acute Care Choice:  Durable Medical Equipment, Home Health Choice offered to:  Patient  DME Arranged:  Walker rolling, Wheelchair manual DME Agency:  [place]:  PT, OT [place] Agency:  [place]  Status of Service:  Completed, signed off  If discussed at [place] of Stay Meetings, dates discussed:    Additional Comments:  Ninfa Meeker, RN 04/11/2017, 1:45 PM

## 2017-04-11 NOTE — Progress Notes (Signed)
Patient suffers from left tibial fracture s/p Intramedullary nail procedure and is non weightbearing on the left lower extremity which impairs their ability to perform daily activities like transferring and mobilizing in the home.  A walker alone will not resolve the issues with performing activities of daily living. A wheelchair will allow patient to safely perform daily activities.  The patient can self propel in the home or has a caregiver who can provide assistance.     Ellamae Sia, PT, DPT Acute Rehabilitation Services

## 2017-04-11 NOTE — Progress Notes (Signed)
O2sats around mid 80's. Adventitious breath sounds in all fields. Highly diminished in lower left lobe. MD notified. Order received to turn fluids to 100 and get chest x-ray.

## 2017-04-11 NOTE — Progress Notes (Signed)
Med consult ordered to help with renal function and pulmonary function given complicating medical comorbidities.    Appreciate Hospitalists help.  Johnny Bridge, MD

## 2017-04-11 NOTE — Progress Notes (Signed)
Creatinine >3, will increase IVF, stop lasix, restart plavix, stopped lovenox.    Recheck in am, consider med consult if worsening.   Johnny Bridge, MD

## 2017-04-11 NOTE — Progress Notes (Signed)
     Subjective:  Patient reports pain as moderate.  He overall feels better than he expected that he would.  He does report having had a recent history of kidney impairment, managed by his primary doctor, Dr. Olena Heckle.  Objective:   VITALS:   Vitals:   04/11/17 0014 04/11/17 0017 04/11/17 0456 04/11/17 0805  BP:  115/60 (!) 142/63   Pulse: 75 62 76   Resp: 20 19 19    Temp:  98 F (36.7 C) 98.3 F (36.8 C)   TempSrc:  Oral Oral   SpO2: 92% 90% 93% 92%    Left foot has sensation and motor intact throughout with good capillary refill and splint is intact and clinical alignment looks good.  Lab Results  Component Value Date   WBC 12.3 (H) 04/10/2017   HGB 11.0 (L) 04/10/2017   HCT 35.7 (L) 04/10/2017   MCV 84.6 04/10/2017   PLT 165 04/10/2017   BMET    Component Value Date/Time   NA 136 03/08/2007 1605   K 4.1 03/08/2007 1605   CL 105 03/08/2007 1605   CO2 22 03/08/2007 1605   GLUCOSE 108 (H) 03/08/2007 1605   BUN 18 03/08/2007 1605   CREATININE 2.71 (H) 04/10/2017 1456   CALCIUM 9.6 03/08/2007 1605   GFRNONAA 23 (L) 04/10/2017 1456   GFRAA 27 (L) 04/10/2017 1456     Assessment/Plan: 1 Day Post-Op   Principal Problem:   Tibia/fibula fracture, shaft, left, open type I or II, initial encounter Active Problems:   Tobacco abuse   Displaced comminuted fracture of shaft of left tibia, initial encounter for open fracture type I or II  Chronic kidney disease, likely stage III, not sure if this is an acute exacerbation but his creatinine is definitely elevated, I will need to review his medication list, encouraged overall oral hydration and IV fluids, may adjust his anticoagulation and medications accordingly, consider med consult if not improved tomorrow, will check recheck a creatinine later today if it is not already ordered.  Physical therapy, nonweightbearing left lower extremity.   Johnny Bridge 04/11/2017, 8:25 AM   Marchia Bond, MD Cell [phone]

## 2017-04-12 ENCOUNTER — Inpatient Hospital Stay (HOSPITAL_COMMUNITY): Payer: Medicare HMO

## 2017-04-12 DIAGNOSIS — Z967 Presence of other bone and tendon implants: Secondary | ICD-10-CM

## 2017-04-12 DIAGNOSIS — S82402B Unspecified fracture of shaft of left fibula, initial encounter for open fracture type I or II: Secondary | ICD-10-CM

## 2017-04-12 DIAGNOSIS — S82202B Unspecified fracture of shaft of left tibia, initial encounter for open fracture type I or II: Secondary | ICD-10-CM

## 2017-04-12 DIAGNOSIS — J9621 Acute and chronic respiratory failure with hypoxia: Secondary | ICD-10-CM

## 2017-04-12 DIAGNOSIS — Z8781 Personal history of (healed) traumatic fracture: Secondary | ICD-10-CM

## 2017-04-12 DIAGNOSIS — N289 Disorder of kidney and ureter, unspecified: Secondary | ICD-10-CM

## 2017-04-12 LAB — BRAIN NATRIURETIC PEPTIDE: B Natriuretic Peptide: 78.9 pg/mL (ref 0.0–100.0)

## 2017-04-12 LAB — BASIC METABOLIC PANEL
Anion gap: 9 (ref 5–15)
BUN: 32 mg/dL — ABNORMAL HIGH (ref 6–20)
CO2: 26 mmol/L (ref 22–32)
Calcium: 8.1 mg/dL — ABNORMAL LOW (ref 8.9–10.3)
Chloride: 96 mmol/L — ABNORMAL LOW (ref 101–111)
Creatinine, Ser: 3.01 mg/dL — ABNORMAL HIGH (ref 0.61–1.24)
GFR calc Af Amer: 24 mL/min — ABNORMAL LOW (ref >60)
GFR calc non Af Amer: 21 mL/min — ABNORMAL LOW (ref >60)
Glucose, Bld: 189 mg/dL — ABNORMAL HIGH (ref 65–99)
Potassium: 4 mmol/L (ref 3.5–5.1)
Sodium: 131 mmol/L — ABNORMAL LOW (ref 135–145)

## 2017-04-12 LAB — URINALYSIS, ROUTINE W REFLEX MICROSCOPIC
Bilirubin Urine: NEGATIVE
Glucose, UA: NEGATIVE mg/dL
Ketones, ur: NEGATIVE mg/dL
Leukocytes, UA: NEGATIVE
Nitrite: NEGATIVE
Protein, ur: NEGATIVE mg/dL
Specific Gravity, Urine: 1.005 (ref 1.005–1.030)
Squamous Epithelial / HPF: NONE SEEN
pH: 6 (ref 5.0–8.0)

## 2017-04-12 LAB — GLUCOSE, CAPILLARY
Glucose-Capillary: 118 mg/dL — ABNORMAL HIGH (ref 65–99)
Glucose-Capillary: 133 mg/dL — ABNORMAL HIGH (ref 65–99)
Glucose-Capillary: 108 mg/dL — ABNORMAL HIGH (ref 65–99)
Glucose-Capillary: 193 mg/dL — ABNORMAL HIGH (ref 65–99)

## 2017-04-12 LAB — TROPONIN I: Troponin I: <0.03 ng/mL (ref <0.03)

## 2017-04-12 MED ORDER — ATORVASTATIN CALCIUM 80 MG PO TABS
80.0000 mg | ORAL_TABLET | Freq: Every day | ORAL | Status: DC
  Administered 2017-04-12 – 2017-04-15 (×4): 80 mg via ORAL
  Filled 2017-04-12 (×4): qty 1

## 2017-04-12 MED ORDER — CEFAZOLIN SODIUM-DEXTROSE 1-4 GM/50ML-% IV SOLN
1.0000 g | Freq: Two times a day (BID) | INTRAVENOUS | Status: AC
  Administered 2017-04-12 – 2017-04-13 (×3): 1 g via INTRAVENOUS
  Filled 2017-04-12 (×3): qty 50

## 2017-04-12 MED ORDER — ALBUTEROL SULFATE (2.5 MG/3ML) 0.083% IN NEBU
2.5000 mg | INHALATION_SOLUTION | RESPIRATORY_TRACT | Status: DC | PRN
  Administered 2017-04-12: 2.5 mg via RESPIRATORY_TRACT
  Filled 2017-04-12 (×2): qty 3

## 2017-04-12 MED ORDER — PANTOPRAZOLE SODIUM 40 MG PO TBEC
40.0000 mg | DELAYED_RELEASE_TABLET | Freq: Two times a day (BID) | ORAL | Status: DC
  Administered 2017-04-12 – 2017-04-15 (×7): 40 mg via ORAL
  Filled 2017-04-12 (×8): qty 1

## 2017-04-12 MED ORDER — GABAPENTIN 600 MG PO TABS: 600.0000 mg | ORAL_TABLET | Freq: Three times a day (TID) | ORAL | Status: DC

## 2017-04-12 NOTE — Progress Notes (Signed)
Patient seen early this morning by my partner.  62 year old male status post tibiofibular fracture repair.  We were consulted for creatinine of 3.0 and hypoxia.  Patient reports that he has long-standing COPD and dependent on oxygen at home.  When I was seeing him today he was wheezing he reported that that was normal for him.  He has been getting breathing treatments.  And currently on oxygen at 2 L.  Will follow renal functions daily.  Chest x-ray showed only COPD no evidence of fluid overload or pleural effusion.  Hold off on starting diuretics.

## 2017-04-12 NOTE — Progress Notes (Addendum)
Physical Therapy Treatment Patient Details Name: Paul [name] MRN: [id number] DOB: 1955-12-06 Today's Date: 04/12/2017    History of Present Illness Pt. is a 63 y.o. M with significant PMH of stroke, neuropathy, and is a current smoker. Patient admitted following a fall resulting in a left tibial fracture s/p IM nail procedure.      PT Comments    Pt progressing towards physical therapy goals. Overall pt with poor safety awareness but was able to follow commands well to maintain safety with mobility and preparation for mobility (locking brakes on w/c, etc). Continue to recommend pt have 24 hour support at home as pt is at high risk for falls. Pt on 4L/min supplemental O2 throughout session and at times sats dropped as low as 78%. Pt instructed in pursed-lip breathing throughout session and sats improved to 87-88%. Will continue to follow and progress as able per POC.   Follow Up Recommendations  Home health PT;Supervision/Assist 24 hour     Equipment Recommendations  Rolling walker with 5" wheels;Wheelchair (measurements PT);Wheelchair cushion (measurements PT); 3-in-1 bedside commode   Recommendations for Other Services       Precautions / Restrictions Precautions Precautions: Fall Restrictions Weight Bearing Restrictions: Yes LLE Weight Bearing: Non weight bearing    Mobility  Bed Mobility Overal bed mobility: Needs Assistance Bed Mobility: Supine to Sit     Supine to sit: Supervision;HOB elevated     General bed mobility comments: Pt was able to transition to EOB without assistance however required cues to remain seated until wheelchair was set up and close to him. Somewhat impulsive.  Transfers Overall transfer level: Needs assistance Equipment used: Rolling walker (2 wheeled) Transfers: Sit to/from Stand Sit to Stand: Min guard         General transfer comment: VC's for hand placement on seated surface for safety. Pt was able to power-up to full stand with  hands-on guarding however no physical assist required.   Ambulation/Gait Ambulation/Gait assistance: Min guard;+2 safety/equipment Ambulation Distance (Feet): 50 Feet Assistive device: Rolling walker (2 wheeled) Gait Pattern/deviations: Step-through pattern;Decreased stride length;Trunk flexed Gait velocity: Decreased Gait velocity interpretation: Below normal speed for age/gender General Gait Details: Chair follow utilized for safety. Pt was able to ambulate ~50' with hands-on assist for balance support and safety. 1 seated rest break required.    Stairs            Wheelchair Mobility    Modified Rankin (Stroke Patients Only)       Balance Overall balance assessment: Needs assistance Sitting-balance support: No upper extremity supported;Feet supported Sitting balance-Leahy Scale: Fair     Standing balance support: Bilateral upper extremity supported Standing balance-Leahy Scale: Poor Standing balance comment: BUE support required on RW                            Cognition Arousal/Alertness: Awake/alert Behavior During Therapy: WFL for tasks assessed/performed Overall Cognitive Status: Impaired/Different from baseline Area of Impairment: Memory;Safety/judgement                     Memory: Decreased short-term memory   Safety/Judgement: Decreased awareness of safety     General Comments: Needs redirection and simple commands to orient to task      Exercises      General Comments        Pertinent Vitals/Pain Pain Assessment: Faces Faces Pain Scale: Hurts little more Pain Location: LLE Pain Descriptors / Indicators: Operative  site guarding;Discomfort Pain Intervention(s): Limited activity within patient's tolerance;Monitored during session;Repositioned    Home Living                      Prior Function            PT Goals (current goals can now be found in the care plan section) Acute Rehab PT Goals Patient Stated  Goal: Go home PT Goal Formulation: With patient Time For Goal Achievement: 04/25/17 Potential to Achieve Goals: Good Progress towards PT goals: Progressing toward goals    Frequency    Min 5X/week      PT Plan Current plan remains appropriate    Co-evaluation              AM-PAC PT "6 Clicks" Daily Activity  Outcome Measure  Difficulty turning over in bed (including adjusting bedclothes, sheets and blankets)?: A Little Difficulty moving from lying on back to sitting on the side of the bed? : A Little Difficulty sitting down on and standing up from a chair with arms (e.g., wheelchair, bedside commode, etc,.)?: A Little Help needed moving to and from a bed to chair (including a wheelchair)?: A Little Help needed walking in hospital room?: A Little Help needed climbing 3-5 steps with a railing? : A Lot 6 Click Score: 17    End of Session Equipment Utilized During Treatment: Gait belt Activity Tolerance: Patient tolerated treatment well Patient left: in chair;with call bell/phone within reach;with family/visitor present Nurse Communication: Mobility status PT Visit Diagnosis: Repeated falls (R29.6);Difficulty in walking, not elsewhere classified (R26.2)     Time: 0930-1009 PT Time Calculation (min) (ACUTE ONLY): 39 min  Charges:  $Gait Training: 38-52 mins                    G Codes:       Rolinda Roan, PT, DPT Acute Rehabilitation Services Pager: [place] 04/12/2017, 11:18 AM

## 2017-04-12 NOTE — Progress Notes (Signed)
     Subjective:  Patient reports pain as mild.  He overall feels well, he is breathing at his baseline, he says that his primary doctor, Dr. Olena Heckle, had discussed possible dialysis with him in the past, which is not sure what his previous creatinine levels were.  Objective:   VITALS:   Vitals:   04/12/17 0815 04/12/17 0816 04/12/17 1515 04/12/17 1623  BP:    107/60  Pulse:    67  Resp:      Temp:    97.8 F (36.6 C)  TempSrc:    Oral  SpO2: 91% 91% 92% 96%    Neurologically intact Splint seems intact,  Lab Results  Component Value Date   WBC 12.7 (H) 04/11/2017   HGB 9.4 (L) 04/11/2017   HCT 30.6 (L) 04/11/2017   MCV 84.8 04/11/2017   PLT 169 04/11/2017   BMET    Component Value Date/Time   NA 131 (L) 04/12/2017 0016   K 4.0 04/12/2017 0016   CL 96 (L) 04/12/2017 0016   CO2 26 04/12/2017 0016   GLUCOSE 189 (H) 04/12/2017 0016   BUN 32 (H) 04/12/2017 0016   CREATININE 3.01 (H) 04/12/2017 0016   CALCIUM 8.1 (L) 04/12/2017 0016   GFRNONAA 21 (L) 04/12/2017 0016   GFRAA 24 (L) 04/12/2017 0016     Assessment/Plan: 2 Days Post-Op   Principal Problem:   Tibia/fibula fracture, shaft, left, open type I or II, initial encounter Active Problems:   Essential hypertension, benign   Tobacco abuse   Displaced comminuted fracture of shaft of left tibia, initial encounter for open fracture type I or II   Renal insufficiency   Acute on chronic respiratory failure with hypoxia (HCC)   Advance diet Up with therapy Touch toe weightbearing left lower extremity, recheck creatinine in the morning, I will also try and get some information from Dr. Olena Heckle office whether or not his baseline is around 3.  His pulmonary function seems to be at baseline.  Possible discharge tomorrow, if medically he is improving.  Anticipated LOS equal to or greater than 2 midnights due to - Age 109 and older with one or more of the following:  - Obesity  - ASA Class 3 and higher  - Expected  need for hospital services (PT, OT, Nursing) required for safe  discharge  - Anticipated need for postoperative skilled nursing care or inpatient rehab  - Active co-morbidities: Respiratory Failure/COPD OR   - Unanticipated findings during/Post Surgery: Lab abnormalities and Slow post-op progression: GI, pain control, mobility  - Patient is a high risk of re-admission due to: None     Paul [name] 04/12/2017, 6:31 PM   Marchia Bond, MD Cell [phone]
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
## 2017-04-13 LAB — GLUCOSE, CAPILLARY
Glucose-Capillary: 121 mg/dL — ABNORMAL HIGH (ref 65–99)
Glucose-Capillary: 95 mg/dL (ref 65–99)
Glucose-Capillary: 155 mg/dL — ABNORMAL HIGH (ref 65–99)
Glucose-Capillary: 129 mg/dL — ABNORMAL HIGH (ref 65–99)

## 2017-04-13 LAB — CBC WITH DIFFERENTIAL/PLATELET
Basophils Absolute: 0 10*3/uL (ref 0.0–0.1)
Basophils Relative: 0 %
Eosinophils Absolute: 0.3 10*3/uL (ref 0.0–0.7)
Eosinophils Relative: 4 %
HCT: 26.4 % — ABNORMAL LOW (ref 39.0–52.0)
Hemoglobin: 8.1 g/dL — ABNORMAL LOW (ref 13.0–17.0)
Lymphocytes Relative: 24 %
Lymphs Abs: 1.7 10*3/uL (ref 0.7–4.0)
MCH: 25.7 pg — ABNORMAL LOW (ref 26.0–34.0)
MCHC: 30.7 g/dL (ref 30.0–36.0)
MCV: 83.8 fL (ref 78.0–100.0)
Monocytes Absolute: 0.4 10*3/uL (ref 0.1–1.0)
Monocytes Relative: 6 %
Neutro Abs: 4.7 10*3/uL (ref 1.7–7.7)
Neutrophils Relative %: 66 %
Platelets: 164 10*3/uL (ref 150–400)
RBC: 3.15 MIL/uL — ABNORMAL LOW (ref 4.22–5.81)
RDW: 16.8 % — ABNORMAL HIGH (ref 11.5–15.5)
WBC: 7.1 10*3/uL (ref 4.0–10.5)

## 2017-04-13 LAB — BASIC METABOLIC PANEL
Anion gap: 10 (ref 5–15)
BUN: 26 mg/dL — ABNORMAL HIGH (ref 6–20)
CO2: 25 mmol/L (ref 22–32)
Calcium: 8.5 mg/dL — ABNORMAL LOW (ref 8.9–10.3)
Chloride: 100 mmol/L — ABNORMAL LOW (ref 101–111)
Creatinine, Ser: 2.62 mg/dL — ABNORMAL HIGH (ref 0.61–1.24)
GFR calc Af Amer: 28 mL/min — ABNORMAL LOW (ref >60)
GFR calc non Af Amer: 24 mL/min — ABNORMAL LOW (ref >60)
Glucose, Bld: 200 mg/dL — ABNORMAL HIGH (ref 65–99)
Potassium: 4.2 mmol/L (ref 3.5–5.1)
Sodium: 135 mmol/L (ref 135–145)

## 2017-04-13 MED ORDER — HYDROCODONE-ACETAMINOPHEN 5-325 MG PO TABS: 1.0000 | ORAL_TABLET | Freq: Four times a day (QID) | ORAL | 0 refills | Status: DC | PRN

## 2017-04-13 MED ORDER — GABAPENTIN 600 MG PO TABS: 600.0000 mg | ORAL_TABLET | Freq: Two times a day (BID) | ORAL | 0 refills | Status: AC

## 2017-04-13 NOTE — Social Work (Signed)
CSW contacted admissions staff at [facility] [place], and they have offered a SNF bed.  SNF will start Insurance Auth. CSW awaiting OT evaluation as well.  CSW will continue to follow for SNF placement.  Elissa Hefty, LCSW Clinical Social Worker [phone]

## 2017-04-13 NOTE — Progress Notes (Signed)
PROGRESS NOTE  Paul [name]  IEP:[id number] DOB: 03/23/1955 DOA: 04/10/2017 PCP: Caryl Bis, MD  Brief Narrative:  62 yo M with history of COPD previously on home oxygen and chronic kidney disease who was hospitalized after a fall that resulted in a tibial/fibular fracture.  He underwent ORIF and medicine was consulted because of his ongoing post-operative hypoxia and elevated creatinine.    Assessment & Plan:  Acute on chronic respiratory failure with hypoxia, patient previously on 3-4L [place].  CXR demonstrates emphysema -  Continue advair and spiriva -  Recommend goal O2 sat 92%  Chronic kidney disease stage 3/4.  Creatinine at admission was 2.47, currently 2.62 and trending down.  Patient does not have acute metabolic acidosis or hyperkalemia.  -  Agree patient stable to follow up with his PCP  Normocytic anemia, likely due to recent surgery superimposed on anemia of renal disease -  Repeat CBC in AM if patient to remain hospitalized -  Check iron studies, B12, folate, TSH -  Agree with iron supplementation  Fall with tib/fib fracture -  Awaiting transfer to SNF vs. Home with home health depending on insurance/bed availability -  F/u with PCP   DVT prophylaxis:  SCDs per orthopedic surgery Disposition Plan:  SNF    Antimicrobials:  Anti-infectives (From admission, onward)   Start     Dose/Rate Route Frequency Ordered Stop   04/12/17 1000  ceFAZolin (ANCEF) IVPB 1 g/50 mL premix     1 g 100 mL/hr over 30 Minutes Intravenous Every 12 hours 04/12/17 0459 04/13/17 0846   04/11/17 0600  ceFAZolin (ANCEF) IVPB 1 g/50 mL premix  Status:  Discontinued     1 g 100 mL/hr over 30 Minutes Intravenous Every 8 hours 04/11/17 0125 04/12/17 0459   04/10/17 1800  ceFAZolin (ANCEF) IVPB 1 g/50 mL premix  Status:  Discontinued     1 g 100 mL/hr over 30 Minutes Intravenous Every 6 hours 04/10/17 1446 04/11/17 0125   04/10/17 1108  ceFAZolin (ANCEF) 2-4 GM/100ML-% IVPB    Comments:   Beckner, Alisha   : cabinet override      04/10/17 1108 04/10/17 2314       Subjective: Feels well.  Breathing comfortably on his 2L [place].  Denies worsening cough or fevers.    Objective: Vitals:   04/13/17 1310 04/13/17 1354 04/13/17 2105 04/13/17 2106  BP: 105/85     Pulse: (!) 54     Resp: 18     Temp: 98.1 F (36.7 C)     TempSrc: Oral     SpO2: 96% 97% 98% 97%    Intake/Output Summary (Last 24 hours) at 04/13/2017 2107 Last data filed at 04/13/2017 1700 Gross per 24 hour  Intake 720 ml  Output 1900 ml  Net -1180 ml   There were no vitals filed for this visit.  Examination:  General exam:  Adult male.  No acute distress.  HEENT:  NCAT, MMM Respiratory system: diminished bilateral BS with faint wheeze, no rales or rhonchi Cardiovascular system: Regular rate and rhythm, normal S1/S2. No murmurs, rubs, gallops or clicks.  Warm extremities Gastrointestinal system: Normal active bowel sounds, soft, nondistended, nontender. MSK:  Normal tone and bulk, left leg wrapped, but able to wiggle toes.  Toes are a slightly swollen.  Less than 2 sec CR Neuro:  Grossly intact    Data Reviewed: I have personally reviewed following labs and imaging studies  CBC: Recent Labs  Lab 04/10/17 1456 04/11/17  0900 04/13/17 0842  WBC 12.3* 12.7* 7.1  NEUTROABS  --   --  4.7  HGB 11.0* 9.4* 8.1*  HCT 35.7* 30.6* 26.4*  MCV 84.6 84.8 83.8  PLT 165 169 408   Basic Metabolic Panel: Recent Labs  Lab 04/10/17 1456 04/11/17 0727 04/12/17 0016 04/13/17 0842  NA  --  135 131* 135  K  --  4.3 4.0 4.2  CL  --  95* 96* 100*  CO2  --  27 26 25   GLUCOSE  --  110* 189* 200*  BUN  --  23* 32* 26*  CREATININE 2.71* 3.03* 3.01* 2.62*  CALCIUM  --  8.1* 8.1* 8.5*   GFR: CrCl cannot be calculated (Unknown ideal weight.). Liver Function Tests: Recent Labs  Lab 04/11/17 0727  AST 27  ALT 12*  ALKPHOS 91  BILITOT 0.7  PROT 6.1*  ALBUMIN 3.2*   No results for input(s): LIPASE, AMYLASE  in the last 168 hours. No results for input(s): AMMONIA in the last 168 hours. Coagulation Profile: No results for input(s): INR, PROTIME in the last 168 hours. Cardiac Enzymes: Recent Labs  Lab 04/12/17 0016  TROPONINI <0.03   BNP (last 3 results) No results for input(s): PROBNP in the last 8760 hours. HbA1C: No results for input(s): HGBA1C in the last 72 hours. CBG: Recent Labs  Lab 04/12/17 1639 04/12/17 2043 04/13/17 0617 04/13/17 1244 04/13/17 1717  GLUCAP 108* 193* 121* 95 155*   Lipid Profile: No results for input(s): CHOL, HDL, LDLCALC, TRIG, CHOLHDL, LDLDIRECT in the last 72 hours. Thyroid Function Tests: No results for input(s): TSH, T4TOTAL, FREET4, T3FREE, THYROIDAB in the last 72 hours. Anemia Panel: No results for input(s): VITAMINB12, FOLATE, FERRITIN, TIBC, IRON, RETICCTPCT in the last 72 hours. Urine analysis:    Component Value Date/Time   COLORURINE STRAW (A) 04/12/2017 0027   APPEARANCEUR CLEAR 04/12/2017 0027   LABSPEC 1.005 04/12/2017 0027   PHURINE 6.0 04/12/2017 0027   GLUCOSEU NEGATIVE 04/12/2017 0027   HGBUR MODERATE (A) 04/12/2017 0027   BILIRUBINUR NEGATIVE 04/12/2017 0027   KETONESUR NEGATIVE 04/12/2017 0027   PROTEINUR NEGATIVE 04/12/2017 0027   NITRITE NEGATIVE 04/12/2017 0027   LEUKOCYTESUR NEGATIVE 04/12/2017 0027   Sepsis Labs: @LABRCNTIP (procalcitonin:4,lacticidven:4)  )No results found for this or any previous visit (from the past 240 hour(s)).    Radiology Studies: Dg Chest Port 1 View  Result Date: 04/12/2017 CLINICAL DATA:  Shortness of breath EXAM: PORTABLE CHEST 1 VIEW COMPARISON:  04/10/2017, 03/21/2017, 08/01/2016 FINDINGS: Coarse interstitial opacities at the lung bases, suspect chronic change. No acute consolidation or effusion. Stable cardiomediastinal silhouette with aortic atherosclerosis. No pneumothorax. Emphysematous disease. IMPRESSION: Emphysematous disease with chronic coarse interstitial bibasilar opacity. No  acute focal pulmonary opacity is seen. Electronically Signed   By: Donavan Foil M.D.   On: 04/12/2017 01:12     Scheduled Meds: . albuterol  2.5 mg Inhalation TID  . aspirin  325 mg Oral Q breakfast  . atorvastatin  80 mg Oral Daily  . carvedilol  12.5 mg Oral BID WC  . clopidogrel  75 mg Oral Daily  . docusate sodium  100 mg Oral BID  . gabapentin  600 mg Oral BID  . insulin aspart  0-15 Units Subcutaneous TID WC  . iron polysaccharides  150 mg Oral BID  . levothyroxine  50 mcg Oral QAC breakfast  . mometasone-formoterol  2 puff Inhalation BID  . montelukast  10 mg Oral QHS  . pantoprazole  40 mg  Oral BID  . tamsulosin  0.4 mg Oral QHS  . tiotropium  18 mcg Inhalation Daily  . venlafaxine XR  75 mg Oral Q breakfast   Continuous Infusions: . methocarbamol (ROBAXIN)  IV       LOS: 3 days    Time spent: 30 min    Janece Canterbury, MD [facility] Pager [phone]  If 7PM-7AM, please contact night-coverage [email or web address] Password [email or web address] 04/13/2017, 9:07 PM

## 2017-04-13 NOTE — Social Work (Signed)
CSW sent OT notes to SNF as they have initiated [employer].  CSW spoke with admissions at [facility] and advised that CSW has sent additional therapy notes.  SNF will f/u with CSW when they receive auth.  CSW will continue to follow.  Elissa Hefty, LCSW Clinical Social Worker [phone]

## 2017-04-13 NOTE — NC FL2 (Signed)
[place] LEVEL OF CARE SCREENING TOOL     IDENTIFICATION  Patient Name: Paul [name] Birthdate: [birth year] Sex: male Admission Date (Current Location): 04/10/2017  [facility] and [place] Number:  [employer] and Address:  [facility], [place] [address], [place], [place] [address]      Provider Number: [id number]  Attending Physician Name and Address:  Marchia Bond, MD  Relative Name and Phone Number:  Maddon Horton, spouse, [phone]    Current Level of Care: Hospital Recommended Level of Care: [place] Prior Approval Number:    Date Approved/Denied:   PASRR Number: [id number]  Discharge Plan: SNF    Current Diagnoses: Patient Active Problem List   Diagnosis Date Noted  . Renal insufficiency 04/12/2017  . Acute on chronic respiratory failure with hypoxia ([place]) 04/12/2017  . Tibia/fibula fracture, shaft, left, open type I or II, initial encounter 04/10/2017  . Displaced comminuted fracture of shaft of left tibia, initial encounter for open fracture type I or II 04/10/2017  . Occlusion and stenosis of carotid artery without mention of cerebral infarction 08/14/2012  . Pain in limb- Hip 08/14/2012  . Numbness and tingling-toenails 08/14/2012  . Mitral regurgitation 04/20/2012  . AAA (abdominal aortic aneurysm) ([place]) 02/11/2012  . Secondary cardiomyopathy, unspecified 01/19/2012  . Essential hypertension, benign 01/19/2012  . Mixed hyperlipidemia 01/19/2012  . Abdominal aortic aneurysm ([place]) 01/19/2012  . Anemia 01/19/2012  . Tobacco abuse 01/19/2012    Orientation RESPIRATION BLADDER Height & Weight     Self, Time, Situation, Place  O2(Nasal Cannula 2L) Continent Weight:   Height:     BEHAVIORAL SYMPTOMS/MOOD NEUROLOGICAL BOWEL NUTRITION STATUS      Continent Diet(See DC Summary)  AMBULATORY STATUS COMMUNICATION OF NEEDS Skin   Extensive Assist Verbally Surgical wounds                        Personal Care Assistance Level of Assistance  Dressing, Bathing, Feeding Bathing Assistance: Maximum assistance Feeding assistance: Limited assistance Dressing Assistance: Maximum assistance     Functional Limitations Info  Sight, Hearing, Speech Sight Info: Adequate Hearing Info: Adequate Speech Info: Adequate    SPECIAL CARE FACTORS FREQUENCY  PT (By licensed PT), OT (By licensed OT)     PT Frequency: 5x week OT Frequency: 5x week            Contractures      Additional Factors Info  Code Status, Allergies Code Status Info: Full Allergies Info: No Known Allergies           Current Medications (04/13/2017):  This is the current hospital active medication list Current Facility-Administered Medications  Medication Dose Route Frequency Provider Last Rate Last Dose  . acetaminophen (TYLENOL) tablet 325-650 mg  325-650 mg Oral Q6H PRN Marchia Bond, MD      . albuterol (PROVENTIL) (2.5 MG/3ML) 0.083% nebulizer solution 2.5 mg  2.5 mg Inhalation TID Marchia Bond, MD   2.5 mg at 04/13/17 0749  . albuterol (PROVENTIL) (2.5 MG/3ML) 0.083% nebulizer solution 2.5 mg  2.5 mg Nebulization Q2H PRN Etta Quill, DO   2.5 mg at 04/12/17 0200  . aspirin EC tablet 325 mg  325 mg Oral Q breakfast Marchia Bond, MD   325 mg at 04/13/17 0814  . atorvastatin (LIPITOR) tablet 80 mg  80 mg Oral Daily Etta Quill, DO   80 mg at 04/13/17 8937  . bisacodyl (DULCOLAX) suppository 10 mg  10 mg Rectal Daily PRN Marchia Bond, MD      . carvedilol (COREG) tablet 12.5 mg  12.5 mg Oral BID WC Marchia Bond, MD   12.5 mg at 04/13/17 0816  . clopidogrel (PLAVIX) tablet 75 mg  75 mg Oral Daily Marchia Bond, MD   75 mg at 04/13/17 0814  . docusate sodium (COLACE) capsule 100 mg  100 mg Oral BID Marchia Bond, MD   100 mg at 04/13/17 0816  . gabapentin (NEURONTIN) capsule 600 mg  600 mg Oral BID Marchia Bond, MD   600 mg at 04/13/17 0814  . insulin aspart (novoLOG) injection 0-15  Units  0-15 Units Subcutaneous TID WC Marchia Bond, MD   2 Units at 04/13/17 [phone]  . iron polysaccharides (NIFEREX) capsule 150 mg  150 mg Oral BID Marchia Bond, MD   150 mg at 04/13/17 0815  . levothyroxine (SYNTHROID, LEVOTHROID) tablet 50 mcg  50 mcg Oral QAC breakfast Marchia Bond, MD   50 mcg at 04/13/17 0815  . magnesium citrate solution 1 Bottle  1 Bottle Oral Once PRN Marchia Bond, MD      . methocarbamol (ROBAXIN) tablet 500 mg  500 mg Oral Q6H PRN Marchia Bond, MD   500 mg at 04/11/17 1003   Or  . methocarbamol (ROBAXIN) 500 mg in dextrose 5 % 50 mL IVPB  500 mg Intravenous Q6H PRN Marchia Bond, MD      . mometasone-formoterol (DULERA) 200-5 MCG/ACT inhaler 2 puff  2 puff Inhalation BID Marchia Bond, MD   2 puff at 04/13/17 0750  . montelukast (SINGULAIR) tablet 10 mg  10 mg Oral QHS Marchia Bond, MD   10 mg at 04/12/17 2157  . ondansetron (ZOFRAN) tablet 4 mg  4 mg Oral Q6H PRN Marchia Bond, MD       Or  . ondansetron [facility]) injection 4 mg  4 mg Intravenous Q6H PRN Marchia Bond, MD      . oxyCODONE (Oxy IR/ROXICODONE) immediate release tablet 5-10 mg  5-10 mg Oral Q4H PRN Marchia Bond, MD   10 mg at 04/12/17 0843  . pantoprazole (PROTONIX) EC tablet 40 mg  40 mg Oral BID Jennette Kettle M, DO   40 mg at 04/13/17 0815  . polyethylene glycol (MIRALAX / GLYCOLAX) packet 17 g  17 g Oral Daily PRN Marchia Bond, MD      . tamsulosin (FLOMAX) capsule 0.4 mg  0.4 mg Oral QHS Marchia Bond, MD   0.4 mg at 04/12/17 2157  . tiotropium (SPIRIVA) inhalation capsule 18 mcg  18 mcg Inhalation Daily Marchia Bond, MD   18 mcg at 04/13/17 0750  . venlafaxine XR (EFFEXOR-XR) 24 hr capsule 75 mg  75 mg Oral Q breakfast Marchia Bond, MD   75 mg at 04/13/17 9741     Discharge Medications: Please see discharge summary for a list of discharge medications.  Relevant Imaging Results:  Relevant Lab Results:   Additional Information SS#: [id number]  [place],  LCSW

## 2017-04-13 NOTE — Clinical Social Work Note (Signed)
Clinical Social Work Assessment  Patient Details  Name: Paul [name] MRN: [id number] Date of Birth: [birth year]  Date of referral:  04/13/17               Reason for consult:  Facility Placement                Permission sought to share information with:  Chartered certified accountant granted to share information::  Yes, Verbal Permission Granted  Name::     Donye Campanelli  Agency::  SNF  Relationship::  daughter or spouse  Contact Information:     Housing/Transportation Living arrangements for the past 2 months:  Single Family Home Source of Information:  Patient, Adult Children Patient Interpreter Needed:  None Criminal Activity/Legal Involvement Pertinent to Current Situation/Hospitalization:  No - Comment as needed Significant Relationships:  Adult Children, Other Family Members, Spouse Lives with:  Self, Spouse Do you feel safe going back to the place where you live?  Yes Need for family participation in patient care:  Yes (Comment)  Care giving concerns:  Pt was initially going home however, physical therapy felt that he was not making enough progress and has now recommended home health. CSW met pt with at bedside and daughter Butch Penny on speaker phone to discuss. CSW explained SNF process and placement. CSW explained the Insurance auth process and the barriers to getting to SNF today.  Pt/family agreeable to SNF only at [place] explained the barriers and was advised that they will not consider other SNFs in [place] or [place].  Pt/family indicated if they cannot get a SNF bed at [facility], then pt will go home with home health.  Social Worker assessment / plan:  CSW will f/u for PT/OT evaluation, FL2, SNF offers, Insurance auth for placement.   Employment status:  Retired Nurse, adult PT Recommendations:  [place], Home with [place] / Referral to community resources:   [place]  Patient/Family's Response to care:  Pt thanked CSW for assistance with disposition to SNF. Pt adamant that if he does not get [facility] for SNF then he would like to return home.   Patient/Family's Understanding of and Emotional Response to Diagnosis, Current Treatment, and Prognosis:  Patient/family has good understanding of diagnosis and impairment and desires to go to SNF at [facility]. Pt will go home if he does not get a bed at [facility] [place]. Pt understands the insurance auth process. Pt voiced understanding. Pt will go home after SNF if if is his choice facility. CSW will follow for disposition.  Emotional Assessment Appearance:  Appears older than stated age Attitude/Demeanor/Rapport:  (Cooperative) Affect (typically observed):  Accepting, Appropriate Orientation:  Oriented to Self, Oriented to Place, Oriented to  Time, Oriented to Situation Alcohol / Substance use:  Not Applicable Psych involvement (Current and /or in the community):  No (Comment)  Discharge Needs  Concerns to be addressed:  Discharge Planning Concerns Readmission within the last 30 days:  No Current discharge risk:  Dependent with Mobility, Physical Impairment Barriers to Discharge:  No Barriers Identified   Normajean Baxter, LCSW 04/13/2017, 12:39 PM

## 2017-04-13 NOTE — Social Work (Signed)
CSW reviewed chart. Pt will need OT evaluation and PT note to recommend SNF placement.  CSW will f/u with clinical team.  Pt will need insurance auth for SNF placement.  CSW will continue to follow.  Elissa Hefty, LCSW Clinical Social Worker [phone]

## 2017-04-13 NOTE — Evaluation (Signed)
Occupational Therapy Evaluation Patient Details Name: Paul [name] MRN: [id number] DOB: October 30, 1955 Today's Date: 04/13/2017    History of Present Illness Pt. is a 62 y.o. M with significant PMH of stroke, neuropathy, and is a current smoker. Patient admitted following a fall resulting in a left tibial fracture s/p IM nail procedure.     Clinical Impression   PTA, pt was living with his daughters and was independent with ADLs and light IADLs; family driving. Currently, pt requires Mod A for LB ADLs and Min Guard A for functional mobility using RW. Pt very agreeable to therapy and motivated to return to PLOF. Pt would benefit form further acute OT to facilitate safe dc. Recommend dc to SNF for further OT to optimize safety and independence with ADLs and functional mobility.      Follow Up Recommendations  SNF;Supervision/Assistance - 24 hour    Equipment Recommendations  Other (comment)(Defer to next venue)    Recommendations for Other Services PT consult     Precautions / Restrictions Precautions Precautions: Fall Restrictions Weight Bearing Restrictions: Yes LLE Weight Bearing: Non weight bearing      Mobility Bed Mobility Overal bed mobility: Needs Assistance Bed Mobility: Sit to Supine       Sit to supine: Supervision   General bed mobility comments: supervision for safety  Transfers Overall transfer level: Needs assistance Equipment used: Rolling walker (2 wheeled) Transfers: Sit to/from [facility] Sit to Stand: Min guard Stand pivot transfers: Min guard       General transfer comment: Min guard for safety. demosntrating adherance to NWB status    Balance Overall balance assessment: Needs assistance Sitting-balance support: No upper extremity supported;Feet supported Sitting balance-Leahy Scale: Fair     Standing balance support: Bilateral upper extremity supported Standing balance-Leahy Scale: Poor Standing balance comment: BUE  support required on RW                           ADL either performed or assessed with clinical judgement   ADL Overall ADL's : Needs assistance/impaired Eating/Feeding: Set up;Sitting   Grooming: Set up;Sitting   Upper Body Bathing: Set up;Sitting;Supervision/ safety   Lower Body Bathing: Moderate assistance;Sit to/from stand   Upper Body Dressing : Set up;Sitting;Supervision/safety   Lower Body Dressing: Moderate assistance;Sit to/from stand Lower Body Dressing Details (indicate cue type and reason): Pt able to reach down and don/doff right sock Toilet Transfer: Min guard;Stand-pivot;RW(simulated in room)           Functional mobility during ADLs: Min guard;Rolling walker(stand pivot only) General ADL Comments: Pt very agreeable to therapy. presenting with decreased balance and cognition. Reports that he has had vision, speech, and cognition deficits since his "four" strokes. agreeable to rehab at SNF     Vision Baseline Vision/History: Wears glasses Wears Glasses: Reading only Patient Visual Report: No change from baseline;Other (comment)(baseline deficits after stroke) Additional Comments: Reports he has double vision and central visual field cut after stroke. Will continue to assess     Perception     Praxis      Pertinent Vitals/Pain Pain Assessment: 0-10 Pain Score: 5  Pain Location: LLE Pain Descriptors / Indicators: Operative site guarding;Discomfort Pain Intervention(s): Monitored during session;Limited activity within patient's tolerance;Repositioned     Hand Dominance Right   Extremity/Trunk Assessment Upper Extremity Assessment Upper Extremity Assessment: Overall WFL for tasks assessed;LUE deficits/detail LUE Deficits / Details: Reports decreased sensation and proprioception in left hand after stroke.  WFL for strength   Lower Extremity Assessment Lower Extremity Assessment: Generalized weakness   Cervical / Trunk Assessment Cervical /  Trunk Assessment: Normal   Communication Communication Communication: HOH   Cognition Arousal/Alertness: Awake/alert Behavior During Therapy: WFL for tasks assessed/performed Overall Cognitive Status: Impaired/Different from baseline Area of Impairment: Memory;Safety/judgement                     Memory: Decreased short-term memory   Safety/Judgement: Decreased awareness of safety     General Comments: history of stroke   General Comments  Pt using purse lip breathing for SpO2 independently. Pt SpO2 dropping to 80s on RA. Placed N/C and pt SpO2 raised to 90s    Exercises     Shoulder Instructions      Home Living Family/patient expects to be discharged to:: Private residence Living Arrangements: Children Available Help at Discharge: Family Type of Home: House Home Access: Stairs to enter Technical brewer of Steps: 1   Home Layout: One level     Bathroom Shower/Tub: Corporate investment banker: Standard     Home Equipment: Environmental consultant - 4 wheels;Transport chair;Bedside commode          Prior Functioning/Environment Level of Independence: Needs assistance  Gait / Transfers Assistance Needed: Independent with household ambulation with no device; needs physical assistance for safety with community ambulation ADL's / Homemaking Assistance Needed: Reports indepedent with ADLs and IADLs   Comments: Sister and daughter drive for shopping and MD apt        OT Problem List: Decreased activity tolerance;Impaired balance (sitting and/or standing);Decreased safety awareness;Decreased knowledge of use of DME or AE;Decreased knowledge of precautions;Pain      OT Treatment/Interventions: Self-care/ADL training;Therapeutic exercise;Energy conservation;DME and/or AE instruction;Therapeutic activities;Patient/family education    OT Goals(Current goals can be found in the care plan section) Acute Rehab OT Goals Patient Stated Goal: Go to rehab and get  stronger OT Goal Formulation: With patient Time For Goal Achievement: 04/27/17 Potential to Achieve Goals: Good ADL Goals Pt Will Perform Lower Body Dressing: with set-up;with supervision;sit to/from stand(adhering to NWB status) Pt Will Transfer to Toilet: with supervision;with set-up;ambulating;bedside commode(adhering to NWB status) Pt Will Perform Toileting - Clothing Manipulation and hygiene: with set-up;with supervision;sit to/from stand(adhering to NWB status)  OT Frequency: Min 2X/week   Barriers to D/C:            Co-evaluation              AM-PAC PT "6 Clicks" Daily Activity     Outcome Measure Help from another person eating meals?: None Help from another person taking care of personal grooming?: A Little Help from another person toileting, which includes using toliet, bedpan, or urinal?: A Little Help from another person bathing (including washing, rinsing, drying)?: A Lot Help from another person to put on and taking off regular upper body clothing?: A Little Help from another person to put on and taking off regular lower body clothing?: A Lot 6 Click Score: 17   End of Session Equipment Utilized During Treatment: Gait belt;Rolling walker Nurse Communication: Mobility status;Weight bearing status  Activity Tolerance: Patient tolerated treatment well Patient left: in bed;with call bell/phone within reach;with bed alarm set  OT Visit Diagnosis: Unsteadiness on feet (R26.81);Other abnormalities of gait and mobility (R26.89);Muscle weakness (generalized) (M62.81);Pain Pain - Right/Left: Left Pain - part of body: Leg                Time: 2671-2458 OT Time  Calculation (min): 19 min Charges:  OT General Charges $OT Visit: 1 Visit OT Evaluation $OT Eval Moderate Complexity: 1 Mod G-Codes:     Briannah Lona MSOT, OTR/L Acute Rehab Pager: [phone] Office: [place] 04/13/2017, 3:56 PM

## 2017-04-13 NOTE — Evaluation (Signed)
Physical Therapy Evaluation Patient Details Name: Paul [name] MRN: [id number] DOB: 03-01-1955 Today's Date: 04/13/2017   History of Present Illness  Pt. is a 62 y.o. M with significant PMH of stroke, neuropathy, and is a current smoker. Patient admitted following a fall resulting in a left tibial fracture s/p IM nail procedure.    Clinical Impression  Patient is progressing towards their physical therapy goals, however, he still presents as a significant fall risk based on history of falls, decreased safety awareness and understanding of precautions, and impaired balance. Patient is impulsive and needs max verbal cueing to maintain safety with mobility and for maintaining nonweightbearing precautions. He continues to become short of breath with minimal physical exertion on 2L of oxygen. At this time, patient states that he is willing to go to SNF to receive further therapy. Highly recommend SNF in order to address deficits, maximize functional independence, and decrease caregiver burden.      Follow Up Recommendations SNF    Equipment Recommendations  Rolling walker with 5" wheels;Wheelchair (measurements PT);Wheelchair cushion (measurements PT);3in1 (PT)    Recommendations for Other Services       Precautions / Restrictions Precautions Precautions: Fall Restrictions Weight Bearing Restrictions: Yes LLE Weight Bearing: Non weight bearing      Mobility  Bed Mobility Overal bed mobility: Needs Assistance Bed Mobility: Supine to Sit     Supine to sit: Supervision;HOB elevated        Transfers Overall transfer level: Needs assistance Equipment used: Rolling walker (2 wheeled) Transfers: Sit to/from Stand Sit to Stand: Min guard         General transfer comment: Patient with max verbal cueing required for hand placement on stable surface and resting L foot forward in order to maintain nonweightbearing precautions.  Ambulation/Gait Ambulation/Gait assistance: +2  safety/equipment;Min assist Ambulation Distance (Feet): 75 Feet Assistive device: Rolling walker (2 wheeled) Gait Pattern/deviations: Step-through pattern;Decreased stride length;Trunk flexed Gait velocity: Decreased Gait velocity interpretation: Below normal speed for age/gender General Gait Details: Chair follow utilized for safety. Pt requiring min assist to maintain balance during ambulation. VC's for upright posture provided but patient unable to maintain.   Stairs            Information systems manager mobility: Yes Wheelchair propulsion: Both upper extremities Wheelchair parts: Needs assistance Distance: 40 Wheelchair Assistance Details (indicate cue type and reason): Patient requiring min assist for wheelchair mobility in order to maintain straight path and negotiate turns.   Modified Rankin (Stroke Patients Only)       Balance Overall balance assessment: Needs assistance Sitting-balance support: No upper extremity supported;Feet supported Sitting balance-Leahy Scale: Fair     Standing balance support: Bilateral upper extremity supported Standing balance-Leahy Scale: Poor Standing balance comment: BUE support required on RW                             Pertinent Vitals/Pain Faces Pain Scale: Hurts little more Pain Location: LLE Pain Descriptors / Indicators: Operative site guarding;Discomfort    Home Living                        Prior Function                 Hand Dominance        Extremity/Trunk Assessment                Communication  Cognition Arousal/Alertness: Awake/alert Behavior During Therapy: WFL for tasks assessed/performed Overall Cognitive Status: Impaired/Different from baseline Area of Impairment: Memory;Safety/judgement                     Memory: Decreased short-term memory   Safety/Judgement: Decreased awareness of safety     General Comments: Needs  redirection and simple, frequent commands to orient to task and maintain safety.      General Comments General comments (skin integrity, edema, etc.): Patient educated on importance of supervision and hands on guarding when OOB performing mobility, pursed lip breathing techniques, and bone healing rates/times.     Exercises General Exercises - Lower Extremity Long Arc Quad: Left;5 reps   Assessment/Plan    PT Assessment    PT Problem List         PT Treatment Interventions      PT Goals (Current goals can be found in the Care Plan section)  Acute Rehab PT Goals Patient Stated Goal: Go home PT Goal Formulation: With patient Time For Goal Achievement: 04/25/17 Potential to Achieve Goals: Good    Frequency Min 5X/week   Barriers to discharge        Co-evaluation               AM-PAC PT "6 Clicks" Daily Activity  Outcome Measure Difficulty turning over in bed (including adjusting bedclothes, sheets and blankets)?: A Little Difficulty moving from lying on back to sitting on the side of the bed? : A Little Difficulty sitting down on and standing up from a chair with arms (e.g., wheelchair, bedside commode, etc,.)?: A Little Help needed moving to and from a bed to chair (including a wheelchair)?: A Little Help needed walking in hospital room?: A Little Help needed climbing 3-5 steps with a railing? : A Lot 6 Click Score: 17    End of Session Equipment Utilized During Treatment: Gait belt Activity Tolerance: Patient tolerated treatment well Patient left: in chair;with call bell/phone within reach;with family/visitor present Nurse Communication: Mobility status PT Visit Diagnosis: Repeated falls (R29.6);Difficulty in walking, not elsewhere classified (R26.2)    Time: 4650-3546 PT Time Calculation (min) (ACUTE ONLY): 36 min   Charges:     PT Treatments $Gait Training: 8-22 mins $Therapeutic Activity: 8-22 mins   PT G Codes:        Ellamae Sia, PT,  DPT Acute Rehabilitation Services    Willy Eddy 04/13/2017, 1:16 PM

## 2017-04-13 NOTE — Discharge Summary (Addendum)
Physician Discharge Summary  Patient ID: Paul [name] MRN: [id number] DOB/AGE: 1955-09-15 62 y.o.  Admit date: 04/10/2017 Discharge date: 04/15/2017  Admission Diagnoses:  Tibia/fibula fracture, shaft, left, open type I or II, initial encounter  Discharge Diagnoses:  Principal Problem:   Tibia/fibula fracture, shaft, left, open type I or II, initial encounter Active Problems:   Essential hypertension, benign   Tobacco abuse   Displaced comminuted fracture of shaft of left tibia, initial encounter for open fracture type I or II   Renal insufficiency   Acute on chronic respiratory failure with hypoxia (HCC) Stage III chronic renal insufficiency Acute on chronic anemia, observed  Past Medical History:  Diagnosis Date  . Abdominal aortic aneurysm (HCC)     4.5 cm by CT 12/3  . Anemia   . Cardiomyopathy ([place])     LVEF 45-50% by echocardiiogrram 12/2  . COPD (chronic obstructive pulmonary disease) ([place])   . Depression   . Essential hypertension, benign   . GERD (gastroesophageal reflux disease)   . Irregular heart beat   . Mitral regurgitation     Mild to moderate  . Mixed hyperlipidemia   . Obstructive sleep apnea   . Peripheral neuropathy   . Tibia/fibula fracture, shaft, left, open type I or II, initial encounter 04/10/2017  . Type 2 diabetes mellitus (HCC)     Surgeries: Procedure(s): INTRAMEDULLARY (IM) NAIL TIBIAL AND IRRIGATION AND DEBRIDEMENT on 04/10/2017   Consultants (if any): Treatment Team:  Kelvin Cellar, MD  Discharged Condition: Improved  Hospital Course: Paul [name] is an 62 y.o. male who was admitted 04/10/2017 with a diagnosis of Tibia/fibula fracture, shaft, left, open type I or II, initial encounter and went to the operating room on 04/10/2017 and underwent the above named procedures.    Postoperatively he had significant renal impairment which is a little bit above what his preoperative laboratory values were, I think this is near his baseline although  the outpatient records are not immediately available.  Nonetheless, I reduced the medications that could impact his renal function, continued his aspirin and Plavix for stroke prevention, avoided the Lovenox due to his renal function, and medical consultation was also performed in order to optimize his chronic hypoxia, which apparently he should be on chronic oxygen, he dipped down into the 85 percentile postoperatively and improved with pulmonary management.  He is now feeling at his baseline, which is probably around the 90th percentile oxygen saturation.  He will need close follow-up with his primary doctor regarding his kidney function and his oxygenation, and I will see him in a week for his leg.  He was given perioperative antibiotics:  Anti-infectives (From admission, onward)   Start     Dose/Rate Route Frequency Ordered Stop   04/15/17 1000  azithromycin (ZITHROMAX) tablet 250 mg     250 mg Oral Daily 04/14/17 1119 04/19/17 0959   04/14/17 1200  azithromycin (ZITHROMAX) tablet 500 mg     500 mg Oral Daily 04/14/17 1119 04/14/17 1229   04/14/17 0000  azithromycin (ZITHROMAX) 250 MG tablet     250 mg Oral Daily 04/14/17 1132     04/12/17 1000  ceFAZolin (ANCEF) IVPB 1 g/50 mL premix     1 g 100 mL/hr over 30 Minutes Intravenous Every 12 hours 04/12/17 0459 04/13/17 0846   04/11/17 0600  ceFAZolin (ANCEF) IVPB 1 g/50 mL premix  Status:  Discontinued     1 g 100 mL/hr over 30 Minutes Intravenous Every 8 hours 04/11/17  0125 04/12/17 0459   04/10/17 1800  ceFAZolin (ANCEF) IVPB 1 g/50 mL premix  Status:  Discontinued     1 g 100 mL/hr over 30 Minutes Intravenous Every 6 hours 04/10/17 1446 04/11/17 0125   04/10/17 1108  ceFAZolin (ANCEF) 2-4 GM/100ML-% IVPB    Comments:  Beckner, Alisha   : cabinet override      04/10/17 1108 04/10/17 2314    .  He was given sequential compression devices, early ambulation, and aspirin and Plavix for DVT prophylaxis.  He benefited maximally from the  hospital stay and there were no complications.    Recent vital signs:  Vitals:   04/15/17 0501 04/15/17 0906  BP: 140/65   Pulse: 61 63  Resp: 16 18  Temp: 97.8 F (36.6 C)   SpO2: 98% 97%    Recent laboratory studies:  Lab Results  Component Value Date   HGB 8.4 (L) 04/15/2017   HGB 8.4 (L) 04/14/2017   HGB 8.1 (L) 04/13/2017   Lab Results  Component Value Date   WBC 6.7 04/15/2017   PLT 198 04/15/2017   No results found for: INR Lab Results  Component Value Date   NA 138 04/15/2017   K 4.7 04/15/2017   CL 102 04/15/2017   CO2 25 04/15/2017   BUN 28 (H) 04/15/2017   CREATININE 2.27 (H) 04/15/2017   GLUCOSE 137 (H) 04/15/2017    Discharge Medications:   Allergies as of 04/15/2017   No Known Allergies     Medication List    TAKE these medications   ADVAIR DISKUS 250-50 MCG/DOSE Aepb Generic drug:  Fluticasone-Salmeterol Inhale 1 puff into the lungs Twice daily.   amLODipine 10 MG tablet Commonly known as:  NORVASC Take 10 mg by mouth daily.   aspirin 325 MG EC tablet Take 325 mg by mouth daily.   atorvastatin 80 MG tablet Commonly known as:  LIPITOR Take 80 mg by mouth daily.   azithromycin 250 MG tablet Commonly known as:  ZITHROMAX Take 1 tablet (250 mg total) by mouth daily.   buPROPion 150 MG 24 hr tablet Commonly known as:  WELLBUTRIN XL Take 150 mg by mouth daily. For depression   clonazePAM 1 MG tablet Commonly known as:  KLONOPIN Take 1 mg by mouth at bedtime.   clopidogrel 75 MG tablet Commonly known as:  PLAVIX Take 75 mg by mouth daily.   furosemide 40 MG tablet Commonly known as:  LASIX Take 1 tablet (40 mg total) by mouth 2 (two) times daily.   gabapentin 600 MG tablet Commonly known as:  NEURONTIN Take 1 tablet (600 mg total) by mouth 2 (two) times daily. What changed:  when to take this   HYDROcodone-acetaminophen 5-325 MG tablet Commonly known as:  NORCO Take 1-2 tablets by mouth every 6 (six) hours as needed for  moderate pain. MAXIMUM TOTAL ACETAMINOPHEN DOSE IS 4000 MG PER DAY   levothyroxine 50 MCG tablet Commonly known as:  SYNTHROID, LEVOTHROID Take 50 mcg by mouth Daily.   montelukast 10 MG tablet Commonly known as:  SINGULAIR Take 10 mg by mouth Daily.   NU-IRON 150 MG capsule Generic drug:  iron polysaccharides Take 150 mg by mouth 2 (two) times daily.   pantoprazole 40 MG tablet Commonly known as:  PROTONIX Take 40 mg by mouth 2 (two) times daily.   potassium chloride SA 20 MEQ tablet Commonly known as:  K-DUR,KLOR-CON Take 20 mEq by mouth daily.   predniSONE 20 MG tablet Commonly  known as:  DELTASONE Take 2 tablets (40 mg total) by mouth daily with breakfast.   SPIRIVA HANDIHALER 18 MCG inhalation capsule Generic drug:  tiotropium Place 1 puff into inhaler and inhale Daily.   tamsulosin 0.4 MG Caps capsule Commonly known as:  FLOMAX Take 0.4 mg by mouth daily.   venlafaxine XR 150 MG 24 hr capsule Commonly known as:  EFFEXOR-XR Take 1 tablet by mouth Daily.   VENTOLIN HFA 108 (90 Base) MCG/ACT inhaler Generic drug:  albuterol Inhale 2 puffs into the lungs Every 4 hours as needed. For shortness of breath            Durable Medical Equipment  (From admission, onward)        Start     Ordered   04/11/17 1343  For home use only DME Walker rolling  Once    Question:  Patient needs a walker to treat with the following condition  Answer:  Tibia/fibula fracture   04/11/17 1344   04/11/17 1249  For home use only DME lightweight manual wheelchair with seat cushion  Once    Comments:  Patient suffers from IM NAiling left tibia which impairs their ability to perform daily activities like ambulating in the home.  A cane  will not resolve  issue with performing activities of daily living. A wheelchair will allow patient to safely perform daily activities. Patient is not able to propel themselves in the home using a standard weight wheelchair due to generalized weakness.  Patient can self propel in the lightweight wheelchair.  Accessories: elevating leg rests (ELRs), wheel locks, extensions and anti-tippers.   04/11/17 1249      Diagnostic Studies: Dg Tibia/fibula Left  Result Date: 04/10/2017 CLINICAL DATA:  Portable imaging for ORIF of a left tibia fracture. EXAM: DG C-ARM 61-120 MIN; LEFT TIBIA AND FIBULA - 2 VIEW COMPARISON:  04/10/2017 at 7:33 a.m. FINDINGS: Six spot fluoro graphic images show placement of an intramedullary rod from the proximal tibia to the distal tibial epiphysis, with associated fixation screws. The intramedullary rod has reduced the distal tibial fracture components, with mild residual dorsal displacement of the middle and proximal fracture components in relation to the distal component, a but no residual angulation. The orthopedic hardware appears well seated. There are distal fibular fixation plates and screws and medial malleolar screws which have reduced in old, healed bimalleolar fracture. IMPRESSION: 1. Significant reduction of the comminuted displaced fracture of the distal left tibia following ORIF with intramedullary rod placement. Electronically Signed   By: Lajean Manes M.D.   On: 04/10/2017 14:19   Dg Chest Port 1 View  Result Date: 04/12/2017 CLINICAL DATA:  Shortness of breath EXAM: PORTABLE CHEST 1 VIEW COMPARISON:  04/10/2017, 03/21/2017, 08/01/2016 FINDINGS: Coarse interstitial opacities at the lung bases, suspect chronic change. No acute consolidation or effusion. Stable cardiomediastinal silhouette with aortic atherosclerosis. No pneumothorax. Emphysematous disease. IMPRESSION: Emphysematous disease with chronic coarse interstitial bibasilar opacity. No acute focal pulmonary opacity is seen. Electronically Signed   By: Donavan Foil M.D.   On: 04/12/2017 01:12   Dg Tibia/fibula Left Port  Result Date: 04/10/2017 CLINICAL DATA:  Post ORIF of left tibia. EXAM: PORTABLE LEFT TIBIA AND FIBULA - 2 VIEW COMPARISON:  Left ankle  radiographs - earlier same day; intraoperative radiographs of the left tibia and fibula - earlier same day FINDINGS: Post intramedullary rod fixation of the tibia. The proximal end of the tibial rod is transfixed with a single cancellous screw while the distal  end is transfixed with 3 interlocking cancellous screws. Improved alignment with persistent displacement of complex distal tibial fracture Minimally displaced fracture involving proximal metadiaphysis of the fibula. Stable sequela of previous combined cancellous and lag screw fixation of the medial malleolus and sideplate fixation of the distal fibula. IMPRESSION: 1. Post intramedullary rod fixation complex distal tibial fracture without evidence of complication. 2. Minimally displaced fracture involving the proximal aspect of the fibula, not seen on previous dedicated ankle radiographs. 3. Post ORIF of the medial and lateral malleoli as detailed above. Electronically Signed   By: Sandi Mariscal M.D.   On: 04/10/2017 15:16   Dg C-arm 1-60 Min  Result Date: 04/10/2017 CLINICAL DATA:  Portable imaging for ORIF of a left tibia fracture. EXAM: DG C-ARM 61-120 MIN; LEFT TIBIA AND FIBULA - 2 VIEW COMPARISON:  04/10/2017 at 7:33 a.m. FINDINGS: Six spot fluoro graphic images show placement of an intramedullary rod from the proximal tibia to the distal tibial epiphysis, with associated fixation screws. The intramedullary rod has reduced the distal tibial fracture components, with mild residual dorsal displacement of the middle and proximal fracture components in relation to the distal component, a but no residual angulation. The orthopedic hardware appears well seated. There are distal fibular fixation plates and screws and medial malleolar screws which have reduced in old, healed bimalleolar fracture. IMPRESSION: 1. Significant reduction of the comminuted displaced fracture of the distal left tibia following ORIF with intramedullary rod placement. Electronically  Signed   By: Lajean Manes M.D.   On: 04/10/2017 14:19   Dg C-arm 1-60 Min  Result Date: 04/10/2017 CLINICAL DATA:  Portable imaging for ORIF of a left tibia fracture. EXAM: DG C-ARM 61-120 MIN; LEFT TIBIA AND FIBULA - 2 VIEW COMPARISON:  04/10/2017 at 7:33 a.m. FINDINGS: Six spot fluoro graphic images show placement of an intramedullary rod from the proximal tibia to the distal tibial epiphysis, with associated fixation screws. The intramedullary rod has reduced the distal tibial fracture components, with mild residual dorsal displacement of the middle and proximal fracture components in relation to the distal component, a but no residual angulation. The orthopedic hardware appears well seated. There are distal fibular fixation plates and screws and medial malleolar screws which have reduced in old, healed bimalleolar fracture. IMPRESSION: 1. Significant reduction of the comminuted displaced fracture of the distal left tibia following ORIF with intramedullary rod placement. Electronically Signed   By: Lajean Manes M.D.   On: 04/10/2017 14:19    Disposition: SNF     Contact information for follow-up providers    Marchia Bond, MD. Schedule an appointment as soon as possible for a visit in 1 week(s).   Specialty:  Orthopedic Surgery Contact information: [address] [place] [address] [phone]        [facility] Follow up.   Specialty:  [place] Why:  A representative from [facility] will contact you to arrange start date and time for your therapy. Contact information: [address] [place] [address] [phone]            Contact information for after-discharge care    Destination    [facility] .   Service:  Skilled Nursing Contact information: [address] [place] [phone]                   Signed: Johnny Bridge 04/15/2017,  10:58 AM

## 2017-04-13 NOTE — Progress Notes (Signed)
     Subjective:  Patient reports pain as mild.  He overall feels well, at his baseline.  Objective:   VITALS:   Vitals:   04/12/17 2018 04/12/17 2038 04/13/17 0452 04/13/17 0750  BP:  116/60 131/63   Pulse:  60 65   Resp:  18 18   Temp:  97.8 F (36.6 C) 97.9 F (36.6 C)   TempSrc:  Oral Oral   SpO2: 94% 97% 99% 100%   Splint intact, sensation and motor intact in the toes on the left side.  No drainage.  Lab Results  Component Value Date   WBC 12.7 (H) 04/11/2017   HGB 9.4 (L) 04/11/2017   HCT 30.6 (L) 04/11/2017   MCV 84.8 04/11/2017   PLT 169 04/11/2017   BMET    Component Value Date/Time   NA 131 (L) 04/12/2017 0016   K 4.0 04/12/2017 0016   CL 96 (L) 04/12/2017 0016   CO2 26 04/12/2017 0016   GLUCOSE 189 (H) 04/12/2017 0016   BUN 32 (H) 04/12/2017 0016   CREATININE 3.01 (H) 04/12/2017 0016   CALCIUM 8.1 (L) 04/12/2017 0016   GFRNONAA 21 (L) 04/12/2017 0016   GFRAA 24 (L) 04/12/2017 0016     Assessment/Plan: 3 Days Post-Op   Principal Problem:   Tibia/fibula fracture, shaft, left, open type I or II, initial encounter Active Problems:   Essential hypertension, benign   Tobacco abuse   Displaced comminuted fracture of shaft of left tibia, initial encounter for open fracture type I or II   Renal insufficiency   Acute on chronic respiratory failure with hypoxia ([place])  Medically he seems to be at his baseline, we may be able to discharge later today, I would defer his kidney function management to his primary physician, appreciate tried hospitalists input.  Touch toe weightbearing, left lower extremity.    Johnny Bridge 04/13/2017, 8:39 AM   Marchia Bond, MD Cell [phone]

## 2017-04-14 DIAGNOSIS — I10 Essential (primary) hypertension: Secondary | ICD-10-CM

## 2017-04-14 LAB — BASIC METABOLIC PANEL
Anion gap: 9 (ref 5–15)
BUN: 24 mg/dL — ABNORMAL HIGH (ref 6–20)
CO2: 28 mmol/L (ref 22–32)
Calcium: 8.8 mg/dL — ABNORMAL LOW (ref 8.9–10.3)
Chloride: 103 mmol/L (ref 101–111)
Creatinine, Ser: 2.33 mg/dL — ABNORMAL HIGH (ref 0.61–1.24)
GFR calc Af Amer: 33 mL/min — ABNORMAL LOW (ref >60)
GFR calc non Af Amer: 28 mL/min — ABNORMAL LOW (ref >60)
Glucose, Bld: 133 mg/dL — ABNORMAL HIGH (ref 65–99)
Potassium: 4.4 mmol/L (ref 3.5–5.1)
Sodium: 140 mmol/L (ref 135–145)

## 2017-04-14 LAB — CBC
HCT: 27 % — ABNORMAL LOW (ref 39.0–52.0)
Hemoglobin: 8.4 g/dL — ABNORMAL LOW (ref 13.0–17.0)
MCH: 26.3 pg (ref 26.0–34.0)
MCHC: 31.1 g/dL (ref 30.0–36.0)
MCV: 84.6 fL (ref 78.0–100.0)
Platelets: 188 10*3/uL (ref 150–400)
RBC: 3.19 MIL/uL — ABNORMAL LOW (ref 4.22–5.81)
RDW: 17.3 % — ABNORMAL HIGH (ref 11.5–15.5)
WBC: 6.1 10*3/uL (ref 4.0–10.5)

## 2017-04-14 LAB — GLUCOSE, CAPILLARY
Glucose-Capillary: 136 mg/dL — ABNORMAL HIGH (ref 65–99)
Glucose-Capillary: 167 mg/dL — ABNORMAL HIGH (ref 65–99)
Glucose-Capillary: 171 mg/dL — ABNORMAL HIGH (ref 65–99)
Glucose-Capillary: 200 mg/dL — ABNORMAL HIGH (ref 65–99)

## 2017-04-14 LAB — IRON AND TIBC
Iron: 20 ug/dL — ABNORMAL LOW (ref 45–182)
Saturation Ratios: 5 % — ABNORMAL LOW (ref 17.9–39.5)
TIBC: 386 ug/dL (ref 250–450)
UIBC: 366 ug/dL

## 2017-04-14 LAB — FOLATE: Folate: 10.2 ng/mL (ref >5.9)

## 2017-04-14 LAB — VITAMIN B12: Vitamin B-12: 219 pg/mL (ref 180–914)

## 2017-04-14 LAB — FERRITIN: Ferritin: 13 ng/mL — ABNORMAL LOW (ref 24–336)

## 2017-04-14 LAB — TSH: TSH: 1.952 u[IU]/mL (ref 0.350–4.500)

## 2017-04-14 LAB — OCCULT BLOOD X 1 CARD TO LAB, STOOL: Fecal Occult Bld: POSITIVE — AB

## 2017-04-14 MED ORDER — PREDNISONE 20 MG PO TABS: 40.0000 mg | ORAL_TABLET | Freq: Every day | ORAL | 0 refills | Status: DC

## 2017-04-14 MED ORDER — AZITHROMYCIN 500 MG PO TABS
500.0000 mg | ORAL_TABLET | Freq: Every day | ORAL | Status: AC
  Administered 2017-04-14: 500 mg via ORAL
  Filled 2017-04-14: qty 1

## 2017-04-14 MED ORDER — AZITHROMYCIN 250 MG PO TABS
250.0000 mg | ORAL_TABLET | Freq: Every day | ORAL | Status: DC
  Administered 2017-04-15: 250 mg via ORAL
  Filled 2017-04-14: qty 1

## 2017-04-14 MED ORDER — PREDNISONE 20 MG PO TABS
40.0000 mg | ORAL_TABLET | Freq: Every day | ORAL | Status: DC
  Administered 2017-04-14 – 2017-04-15 (×2): 40 mg via ORAL
  Filled 2017-04-14 (×2): qty 2

## 2017-04-14 MED ORDER — BISACODYL 10 MG RE SUPP
10.0000 mg | Freq: Once | RECTAL | Status: AC
  Administered 2017-04-14: 10 mg via RECTAL
  Filled 2017-04-14: qty 1

## 2017-04-14 MED ORDER — HYDROCODONE-ACETAMINOPHEN 5-325 MG PO TABS: 1.0000 | ORAL_TABLET | Freq: Four times a day (QID) | ORAL | 0 refills | Status: DC | PRN

## 2017-04-14 MED ORDER — FUROSEMIDE 40 MG PO TABS: 40.0000 mg | ORAL_TABLET | Freq: Two times a day (BID) | ORAL | 0 refills | Status: AC

## 2017-04-14 MED ORDER — AZITHROMYCIN 250 MG PO TABS: 250.0000 mg | ORAL_TABLET | Freq: Every day | ORAL | 0 refills | Status: DC

## 2017-04-14 NOTE — Social Work (Addendum)
CSW called SNF to see if they have obtained Insurance Auth for SNF placement. Per admission staff, Insurance Auth not received yet.  CSW will continue to follow up.  4:30pm: CSW received call from SNF that they have received auth for patient, however they cannot take patient after 4pm because they are unable to get medications for tonight. Pt will have to dc tomorrow morning, per admission staff.  CSW advised clinical staff that we can get patient to SNF in the morning.   SNF will need updated dc.  CSW will f/u for disposition in the morning.  Elissa Hefty, LCSW Clinical Social Worker [phone]

## 2017-04-14 NOTE — Progress Notes (Signed)
Physical Therapy Treatment Patient Details Name: Paul [name] MRN: [id number] DOB: 04/18/1955 Today's Date: 04/14/2017    History of Present Illness Pt. is a 62 y.o. M with significant PMH of stroke, neuropathy, and is a current smoker. Patient admitted following a fall resulting in a left tibial fracture s/p IM nail procedure.      PT Comments    Patient treatment limited by patient symptoms of feeling hot and diaphoretic; significant wheezing with all mobility. MD present and aware. Patient sats dropped at times to 86% SpO2 on 3L [place] during ambulation but rebounded quickly to 97% SpO2 with seated break. Patients continues to present as a significant fall risk and therefore recommend SNF in order to maximize functional independence and decrease caregiver burden.    Follow Up Recommendations  SNF     Equipment Recommendations  Rolling walker with 5" wheels;Wheelchair (measurements PT);Wheelchair cushion (measurements PT);3in1 (PT)    Recommendations for Other Services       Precautions / Restrictions Precautions Precautions: Fall Restrictions Weight Bearing Restrictions: Yes LLE Weight Bearing: Non weight bearing    Mobility  Bed Mobility Overal bed mobility: Needs Assistance Bed Mobility: Supine to Sit       Sit to supine: Supervision   General bed mobility comments: supervision for safety  Transfers Overall transfer level: Needs assistance Equipment used: Rolling walker (2 wheeled) Transfers: Sit to/from [facility] Sit to Stand: Min assist;Mod assist Stand pivot transfers: Min guard       General transfer comment: Patient requiring min-mod assist for sit to stand transfers with RW. One episode of significant posterior lean in standing requiring modA to correct. Demonstrates adherence to weightbearing precautions.   Ambulation/Gait Ambulation/Gait assistance: +2 safety/equipment;Min assist Ambulation Distance (Feet): 40 Feet Assistive device:  Rolling walker (2 wheeled) Gait Pattern/deviations: Decreased step length - right Gait velocity: Decreased Gait velocity interpretation: Below normal speed for age/gender General Gait Details: Chair follow utilized for safety. Pt requiring min assist to maintain balance during ambulation. One seated rest break required.    Stairs            Information systems manager mobility: Yes Wheelchair propulsion: Both upper extremities Wheelchair parts: Needs assistance Distance: 40 Wheelchair Assistance Details (indicate cue type and reason): Patient requiring min assist for wheelchair mobility in order to maintain straight path  Modified Rankin (Stroke Patients Only)       Balance Overall balance assessment: Needs assistance Sitting-balance support: No upper extremity supported;Feet supported Sitting balance-Leahy Scale: Fair     Standing balance support: Bilateral upper extremity supported Standing balance-Leahy Scale: Poor Standing balance comment: BUE support required on RW                            Cognition Arousal/Alertness: Awake/alert Behavior During Therapy: WFL for tasks assessed/performed Overall Cognitive Status: Impaired/Different from baseline Area of Impairment: Memory;Safety/judgement                     Memory: Decreased short-term memory   Safety/Judgement: Decreased awareness of safety     General Comments: history of stroke      Exercises      General Comments        Pertinent Vitals/Pain Pain Assessment: Faces Faces Pain Scale: Hurts little more Pain Location: LLE Pain Descriptors / Indicators: Operative site guarding Pain Intervention(s): Monitored during session    Home Living  Prior Function            PT Goals (current goals can now be found in the care plan section) Acute Rehab PT Goals Patient Stated Goal: Go to rehab and get stronger PT Goal  Formulation: With patient Time For Goal Achievement: 04/25/17 Potential to Achieve Goals: Good Progress towards PT goals: Progressing toward goals    Frequency    Min 5X/week      PT Plan Current plan remains appropriate    Co-evaluation              AM-PAC PT "6 Clicks" Daily Activity  Outcome Measure  Difficulty turning over in bed (including adjusting bedclothes, sheets and blankets)?: A Little Difficulty moving from lying on back to sitting on the side of the bed? : A Little Difficulty sitting down on and standing up from a chair with arms (e.g., wheelchair, bedside commode, etc,.)?: A Lot Help needed moving to and from a bed to chair (including a wheelchair)?: A Little Help needed walking in hospital room?: A Little Help needed climbing 3-5 steps with a railing? : Total 6 Click Score: 15    End of Session Equipment Utilized During Treatment: Gait belt Activity Tolerance: Patient limited by fatigue Patient left: in chair;with call bell/phone within reach;with chair alarm set;Other (comment)(MD in room)   PT Visit Diagnosis: Repeated falls (R29.6);Difficulty in walking, not elsewhere classified (R26.2)     Time: 2947-6546 PT Time Calculation (min) (ACUTE ONLY): 30 min  Charges:  $Gait Training: 8-22 mins $Therapeutic Activity: 8-22 mins                    G Codes:       Ellamae Sia, PT, DPT Acute Rehabilitation Services     Willy Eddy 04/14/2017, 12:16 PM

## 2017-04-14 NOTE — Progress Notes (Signed)
Plan SNF today if bed available.    TTWB LLE.

## 2017-04-14 NOTE — Progress Notes (Signed)
PROGRESS NOTE  Paul [name]  BWG:[id number] DOB: November 09, 1955 DOA: 04/10/2017 PCP: Caryl Bis, MD  Brief Narrative:  62 yo M with history of COPD previously on home oxygen and chronic kidney disease who was hospitalized after a fall that resulted in a tibial/fibular fracture.  He underwent ORIF and medicine was consulted because of his ongoing post-operative hypoxia and elevated creatinine.    Assessment & Plan:  Acute on chronic respiratory failure with hypoxia, patient previously on 3-4L [place]. CXR demonstrates emphysema.  Had increased dyspnea with exertion during hospitalization and became diaphoretic suggestive of acute COPD exacerbation.  -  Continue advair and spiriva -  Recommend goal O2 sat 92% -  Start prednisone burst 40mg  once daily x 5 days, first dose on 3/7 -  Start Z-pack, first dose on 3/7  Chronic kidney disease stage 3/4.  Baseline creatinine as outpatient per PCP is 2.4 mg/dl, creatinine at baseline -  Start lasix to 40mg  once daily to prevent volume overload.  Was previously on 40mg  po BID.  -  Agree patient stable to follow up with his PCP  Iron deficiency anemia, likely due to recent surgery superimposed on anemia of renal disease -  Ferritin 13 with low iron level and iron sat, B12 212, folate wnl -  Iron supplementation started -  Anemia f/u in 3-4 weeks  Fall with tib/fib fracture -  Awaiting transfer to SNF vs. Home with home health depending on insurance/bed availability -  F/u with PCP   DVT prophylaxis:  SCDs per orthopedic surgery Disposition Plan:  SNF    Antimicrobials:  Anti-infectives (From admission, onward)   Start     Dose/Rate Route Frequency Ordered Stop   04/15/17 1000  azithromycin (ZITHROMAX) tablet 250 mg     250 mg Oral Daily 04/14/17 1119 04/19/17 0959   04/14/17 1200  azithromycin (ZITHROMAX) tablet 500 mg     500 mg Oral Daily 04/14/17 1119 04/15/17 0959   04/14/17 0000  azithromycin (ZITHROMAX) 250 MG tablet     250 mg Oral  Daily 04/14/17 1132     04/12/17 1000  ceFAZolin (ANCEF) IVPB 1 g/50 mL premix     1 g 100 mL/hr over 30 Minutes Intravenous Every 12 hours 04/12/17 0459 04/13/17 0846   04/11/17 0600  ceFAZolin (ANCEF) IVPB 1 g/50 mL premix  Status:  Discontinued     1 g 100 mL/hr over 30 Minutes Intravenous Every 8 hours 04/11/17 0125 04/12/17 0459   04/10/17 1800  ceFAZolin (ANCEF) IVPB 1 g/50 mL premix  Status:  Discontinued     1 g 100 mL/hr over 30 Minutes Intravenous Every 6 hours 04/10/17 1446 04/11/17 0125   04/10/17 1108  ceFAZolin (ANCEF) 2-4 GM/100ML-% IVPB    Comments:  Beckner, Alisha   : cabinet override      04/10/17 1108 04/10/17 2314       Subjective: Feeling SOB and got hot and diaphoretic when working with PCP.  Feels tight in his chest and wheezy.    Objective: Vitals:   04/13/17 2105 04/13/17 2106 04/13/17 2135 04/14/17 0455  BP:  117/61 117/61 136/72  Pulse:  63 63 66  Resp:  16 16 16   Temp:  98.1 F (36.7 C) 98.1 F (36.7 C) 98 F (36.7 C)  TempSrc:  Oral Oral Oral  SpO2: 98% 97% 91% 94%    Intake/Output Summary (Last 24 hours) at 04/14/2017 1137 Last data filed at 04/14/2017 0600 Gross per 24 hour  Intake  720 ml  Output 1100 ml  Net -380 ml   There were no vitals filed for this visit.  Examination:  General exam:  Adult male.  No acute distress.  HEENT:  NCAT, MMM Respiratory system: Diminished bilateral BS, wheezing, no rales or rhonchi Cardiovascular system: Regular rate and rhythm, normal S1/S2. No murmurs, rubs, gallops or clicks.  Warm extremities Gastrointestinal system: Normal active bowel sounds, soft, nondistended, nontender. MSK:  Normal tone and bulk, no lower extremity edema,  left leg wrapped, but able to wiggle toes.  Toes are a slightly swollen.  Less than 2 sec CR Neuro:  Grossly intact  Data Reviewed: I have personally reviewed following labs and imaging studies  CBC: Recent Labs  Lab 04/10/17 1456 04/11/17 0900 04/13/17 0842  04/14/17 0504  WBC 12.3* 12.7* 7.1 6.1  NEUTROABS  --   --  4.7  --   HGB 11.0* 9.4* 8.1* 8.4*  HCT 35.7* 30.6* 26.4* 27.0*  MCV 84.6 84.8 83.8 84.6  PLT 165 169 164 970   Basic Metabolic Panel: Recent Labs  Lab 04/10/17 1456 04/11/17 0727 04/12/17 0016 04/13/17 0842 04/14/17 0504  NA  --  135 131* 135 140  K  --  4.3 4.0 4.2 4.4  CL  --  95* 96* 100* 103  CO2  --  27 26 25 28   GLUCOSE  --  110* 189* 200* 133*  BUN  --  23* 32* 26* 24*  CREATININE 2.71* 3.03* 3.01* 2.62* 2.33*  CALCIUM  --  8.1* 8.1* 8.5* 8.8*   GFR: CrCl cannot be calculated (Unknown ideal weight.). Liver Function Tests: Recent Labs  Lab 04/11/17 0727  AST 27  ALT 12*  ALKPHOS 91  BILITOT 0.7  PROT 6.1*  ALBUMIN 3.2*   No results for input(s): LIPASE, AMYLASE in the last 168 hours. No results for input(s): AMMONIA in the last 168 hours. Coagulation Profile: No results for input(s): INR, PROTIME in the last 168 hours. Cardiac Enzymes: Recent Labs  Lab 04/12/17 0016  TROPONINI <0.03   BNP (last 3 results) No results for input(s): PROBNP in the last 8760 hours. HbA1C: No results for input(s): HGBA1C in the last 72 hours. CBG: Recent Labs  Lab 04/13/17 0617 04/13/17 1244 04/13/17 1717 04/13/17 2134 04/14/17 0600  GLUCAP 121* 95 155* 129* 136*   Lipid Profile: No results for input(s): CHOL, HDL, LDLCALC, TRIG, CHOLHDL, LDLDIRECT in the last 72 hours. Thyroid Function Tests: No results for input(s): TSH, T4TOTAL, FREET4, T3FREE, THYROIDAB in the last 72 hours. Anemia Panel: Recent Labs    04/14/17 0501 04/14/17 0504  VITAMINB12  --  219  FOLATE 10.2  --   FERRITIN  --  13*  TIBC  --  386  IRON  --  20*   Urine analysis:    Component Value Date/Time   COLORURINE STRAW (A) 04/12/2017 0027   APPEARANCEUR CLEAR 04/12/2017 0027   LABSPEC 1.005 04/12/2017 0027   PHURINE 6.0 04/12/2017 0027   GLUCOSEU NEGATIVE 04/12/2017 0027   HGBUR MODERATE (A) 04/12/2017 0027   BILIRUBINUR  NEGATIVE 04/12/2017 0027   KETONESUR NEGATIVE 04/12/2017 0027   PROTEINUR NEGATIVE 04/12/2017 0027   NITRITE NEGATIVE 04/12/2017 0027   LEUKOCYTESUR NEGATIVE 04/12/2017 0027   Sepsis Labs: @LABRCNTIP (procalcitonin:4,lacticidven:4)  )No results found for this or any previous visit (from the past 240 hour(s)).    Radiology Studies: No results found.   Scheduled Meds: . albuterol  2.5 mg Inhalation TID  . aspirin  325 mg  Oral Q breakfast  . atorvastatin  80 mg Oral Daily  . azithromycin  500 mg Oral Daily   Followed by  . [START ON 04/15/2017] azithromycin  250 mg Oral Daily  . carvedilol  12.5 mg Oral BID WC  . clopidogrel  75 mg Oral Daily  . docusate sodium  100 mg Oral BID  . gabapentin  600 mg Oral BID  . insulin aspart  0-15 Units Subcutaneous TID WC  . iron polysaccharides  150 mg Oral BID  . levothyroxine  50 mcg Oral QAC breakfast  . mometasone-formoterol  2 puff Inhalation BID  . montelukast  10 mg Oral QHS  . pantoprazole  40 mg Oral BID  . predniSONE  40 mg Oral Q breakfast  . tamsulosin  0.4 mg Oral QHS  . tiotropium  18 mcg Inhalation Daily  . venlafaxine XR  75 mg Oral Q breakfast   Continuous Infusions: . methocarbamol (ROBAXIN)  IV       LOS: 4 days    Time spent: 30 min    Janece Canterbury, MD [facility] Pager [phone]  If 7PM-7AM, please contact night-coverage [email or web address] Password [facility] 04/14/2017, 11:37 AM

## 2017-04-14 NOTE — Progress Notes (Signed)
Patient seen and evaluated.  He is overall doing well, he has had some exacerbation of the COPD with some shortness of breath, slightly worse than baseline.  I have spoken with the internal medicine physicians who have started him on prednisone and azithromycin, the discharge summary has been updated, and he is getting treatment while here in the hospital.  On exam his splint is intact and sensation intact in his feet EHL is intact.  Plan for discharge to skilled nursing facility later today, I have written his prescription for hydrocodone, signed it, placed on the chart, and also updated his discharge summary with the prednisone, hydrocodone, and also the azithromycin.

## 2017-04-15 DIAGNOSIS — M6281 Muscle weakness (generalized): Secondary | ICD-10-CM | POA: Diagnosis not present

## 2017-04-15 DIAGNOSIS — G2581 Restless legs syndrome: Secondary | ICD-10-CM | POA: Diagnosis not present

## 2017-04-15 DIAGNOSIS — G4733 Obstructive sleep apnea (adult) (pediatric): Secondary | ICD-10-CM | POA: Diagnosis not present

## 2017-04-15 DIAGNOSIS — F329 Major depressive disorder, single episode, unspecified: Secondary | ICD-10-CM | POA: Diagnosis not present

## 2017-04-15 DIAGNOSIS — I129 Hypertensive chronic kidney disease with stage 1 through stage 4 chronic kidney disease, or unspecified chronic kidney disease: Secondary | ICD-10-CM | POA: Diagnosis not present

## 2017-04-15 DIAGNOSIS — R1312 Dysphagia, oropharyngeal phase: Secondary | ICD-10-CM | POA: Diagnosis not present

## 2017-04-15 DIAGNOSIS — J9621 Acute and chronic respiratory failure with hypoxia: Secondary | ICD-10-CM | POA: Diagnosis not present

## 2017-04-15 DIAGNOSIS — S82402D Unspecified fracture of shaft of left fibula, subsequent encounter for closed fracture with routine healing: Secondary | ICD-10-CM | POA: Diagnosis not present

## 2017-04-15 DIAGNOSIS — S8990XA Unspecified injury of unspecified lower leg, initial encounter: Secondary | ICD-10-CM | POA: Diagnosis not present

## 2017-04-15 DIAGNOSIS — F419 Anxiety disorder, unspecified: Secondary | ICD-10-CM | POA: Diagnosis not present

## 2017-04-15 DIAGNOSIS — G8911 Acute pain due to trauma: Secondary | ICD-10-CM | POA: Diagnosis not present

## 2017-04-15 DIAGNOSIS — E1142 Type 2 diabetes mellitus with diabetic polyneuropathy: Secondary | ICD-10-CM | POA: Diagnosis not present

## 2017-04-15 DIAGNOSIS — E1122 Type 2 diabetes mellitus with diabetic chronic kidney disease: Secondary | ICD-10-CM | POA: Diagnosis not present

## 2017-04-15 DIAGNOSIS — S82252D Displaced comminuted fracture of shaft of left tibia, subsequent encounter for closed fracture with routine healing: Secondary | ICD-10-CM | POA: Diagnosis not present

## 2017-04-15 DIAGNOSIS — S82202B Unspecified fracture of shaft of left tibia, initial encounter for open fracture type I or II: Secondary | ICD-10-CM | POA: Diagnosis not present

## 2017-04-15 DIAGNOSIS — Z9181 History of falling: Secondary | ICD-10-CM | POA: Diagnosis not present

## 2017-04-15 DIAGNOSIS — I429 Cardiomyopathy, unspecified: Secondary | ICD-10-CM | POA: Diagnosis not present

## 2017-04-15 DIAGNOSIS — Z9981 Dependence on supplemental oxygen: Secondary | ICD-10-CM | POA: Diagnosis not present

## 2017-04-15 DIAGNOSIS — X58XXXD Exposure to other specified factors, subsequent encounter: Secondary | ICD-10-CM | POA: Diagnosis not present

## 2017-04-15 DIAGNOSIS — S82202D Unspecified fracture of shaft of left tibia, subsequent encounter for closed fracture with routine healing: Secondary | ICD-10-CM | POA: Diagnosis not present

## 2017-04-15 DIAGNOSIS — J449 Chronic obstructive pulmonary disease, unspecified: Secondary | ICD-10-CM | POA: Diagnosis not present

## 2017-04-15 DIAGNOSIS — N183 Chronic kidney disease, stage 3 (moderate): Secondary | ICD-10-CM | POA: Diagnosis not present

## 2017-04-15 LAB — CBC
HCT: 26.5 % — ABNORMAL LOW (ref 39.0–52.0)
Hemoglobin: 8.4 g/dL — ABNORMAL LOW (ref 13.0–17.0)
MCH: 26.8 pg (ref 26.0–34.0)
MCHC: 31.7 g/dL (ref 30.0–36.0)
MCV: 84.4 fL (ref 78.0–100.0)
Platelets: 198 10*3/uL (ref 150–400)
RBC: 3.14 MIL/uL — ABNORMAL LOW (ref 4.22–5.81)
RDW: 17.3 % — ABNORMAL HIGH (ref 11.5–15.5)
WBC: 6.7 10*3/uL (ref 4.0–10.5)

## 2017-04-15 LAB — BASIC METABOLIC PANEL
Anion gap: 11 (ref 5–15)
BUN: 28 mg/dL — ABNORMAL HIGH (ref 6–20)
CO2: 25 mmol/L (ref 22–32)
Calcium: 8.8 mg/dL — ABNORMAL LOW (ref 8.9–10.3)
Chloride: 102 mmol/L (ref 101–111)
Creatinine, Ser: 2.27 mg/dL — ABNORMAL HIGH (ref 0.61–1.24)
GFR calc Af Amer: 34 mL/min — ABNORMAL LOW (ref >60)
GFR calc non Af Amer: 29 mL/min — ABNORMAL LOW (ref >60)
Glucose, Bld: 137 mg/dL — ABNORMAL HIGH (ref 65–99)
Potassium: 4.7 mmol/L (ref 3.5–5.1)
Sodium: 138 mmol/L (ref 135–145)

## 2017-04-15 LAB — GLUCOSE, CAPILLARY
Glucose-Capillary: 126 mg/dL — ABNORMAL HIGH (ref 65–99)
Glucose-Capillary: 162 mg/dL — ABNORMAL HIGH (ref 65–99)

## 2017-04-15 NOTE — Clinical Social Work Placement (Signed)
   CLINICAL SOCIAL WORK PLACEMENT  NOTE  Date:  04/15/2017  Patient Details  Name: Paul [name] MRN: [id number] Date of Birth: 1955-12-24  Clinical Social Work is seeking post-discharge placement for this patient at the [place] level of care (*CSW will initial, date and re-position this form in  chart as items are completed):  Yes   Patient/family provided with [facility] Work Department's list of facilities offering this level of care within the geographic area requested by the patient (or if unable, by the patient's family).  Yes   Patient/family informed of their freedom to choose among providers that offer the needed level of care, that participate in Medicare, Medicaid or managed care program needed by the patient, have an available bed and are willing to accept the patient.  Yes   Patient/family informed of [facility]'s ownership interest in [facility] and [facility], as well as of the fact that they are under no obligation to receive care at these facilities.  PASRR submitted to EDS on       PASRR number received on       Existing PASRR number confirmed on 04/13/17     FL2 transmitted to all facilities in geographic area requested by pt/family on       FL2 transmitted to all facilities within larger geographic area on 04/13/17     Patient informed that his/her managed care company has contracts with or will negotiate with certain facilities, including the following:        Yes   Patient/family informed of bed offers received.  Patient chooses bed at [facility])     Physician recommends and patient chooses bed at      Patient to be transferred to [facility]) on 04/15/17.  Patient to be transferred to facility by [facility]     Patient family notified on 04/15/17 of transfer.  Name of family member notified:  pt responsible for self     PHYSICIAN       Additional Comment:     _______________________________________________ Normajean Baxter, LCSW 04/15/2017, 10:45 AM

## 2017-04-15 NOTE — Care Management Important Message (Signed)
Important Message  Patient Details  Name: Paul [name] MRN: [id number] Date of Birth: 1955/07/17   Medicare Important Message Given:  Yes    Orbie Pyo 04/15/2017, 12:47 PM

## 2017-04-15 NOTE — Progress Notes (Signed)
Pt being discharged to [facility] via [facility]. Pt alert and oriented x4. VSS. Pt c/o no pain at this time. No signs of respiratory distress. Education complete and care plans resolved. IV removed with catheter intact and pt tolerated well. No further issues at this time. Pt to follow up with PCP. Leanne Chang, RN

## 2017-04-15 NOTE — Progress Notes (Signed)
Patient seen and examined, no significant clinical changes, breathing ok.  Plan snf today.  Johnny Bridge, MD

## 2017-04-15 NOTE — Progress Notes (Signed)
Patient states that he feels better today.  He is less Anouk Critzer of breath at rest.  He is anxious to go to rehab.  His lungs sound like he is aerating a little bit better and he is less wheezy today.  No change to the previous plan of steroids and antibiotics.  Patient stable for transfer to skilled nursing facility today.

## 2017-04-15 NOTE — Progress Notes (Signed)
PT Cancellation Note  Patient Details Name: Paul [name] MRN: [id number] DOB: 1955-03-04   Cancelled Treatment:    Reason Eval/Treat Not Completed: Other (comment) Patient states "he has a lot on his mind right now." Explained importance of mobility, but patient politely declined. Requesting to PT to come back later. Will follow up as schedule allows.   Ellamae Sia, PT, DPT Acute Rehabilitation Services      Willy Eddy 04/15/2017, 10:26 AM

## 2017-04-15 NOTE — Social Work (Signed)
Clinical Social Worker facilitated patient discharge including contacting patient family and facility to confirm patient discharge plans.  Clinical information faxed to facility and family agreeable with plan.    CSW arranged ambulance transport via [facility] to [facility].    RN to call [phone] to give report prior to discharge.  Clinical Social Worker will sign off for now as social work intervention is no longer needed. Please consult [place] again if new need arises.  Elissa Hefty, LCSW Clinical Social Worker [phone]

## 2017-04-16 DIAGNOSIS — S82202B Unspecified fracture of shaft of left tibia, initial encounter for open fracture type I or II: Secondary | ICD-10-CM | POA: Diagnosis not present

## 2017-04-27 DIAGNOSIS — S82252D Displaced comminuted fracture of shaft of left tibia, subsequent encounter for closed fracture with routine healing: Secondary | ICD-10-CM | POA: Diagnosis not present

## 2017-05-12 DIAGNOSIS — R0902 Hypoxemia: Secondary | ICD-10-CM | POA: Diagnosis not present

## 2017-05-12 DIAGNOSIS — I10 Essential (primary) hypertension: Secondary | ICD-10-CM | POA: Diagnosis not present

## 2017-05-12 DIAGNOSIS — G4733 Obstructive sleep apnea (adult) (pediatric): Secondary | ICD-10-CM | POA: Diagnosis not present

## 2017-05-12 DIAGNOSIS — R911 Solitary pulmonary nodule: Secondary | ICD-10-CM | POA: Diagnosis not present

## 2017-05-12 DIAGNOSIS — F331 Major depressive disorder, recurrent, moderate: Secondary | ICD-10-CM | POA: Diagnosis not present

## 2017-05-12 DIAGNOSIS — S82202B Unspecified fracture of shaft of left tibia, initial encounter for open fracture type I or II: Secondary | ICD-10-CM | POA: Diagnosis not present

## 2017-05-12 DIAGNOSIS — S82402B Unspecified fracture of shaft of left fibula, initial encounter for open fracture type I or II: Secondary | ICD-10-CM | POA: Diagnosis not present

## 2017-05-12 DIAGNOSIS — N184 Chronic kidney disease, stage 4 (severe): Secondary | ICD-10-CM | POA: Diagnosis not present

## 2017-05-12 DIAGNOSIS — F17210 Nicotine dependence, cigarettes, uncomplicated: Secondary | ICD-10-CM | POA: Diagnosis not present

## 2017-05-12 DIAGNOSIS — E1142 Type 2 diabetes mellitus with diabetic polyneuropathy: Secondary | ICD-10-CM | POA: Diagnosis not present

## 2017-05-12 DIAGNOSIS — R05 Cough: Secondary | ICD-10-CM | POA: Diagnosis not present

## 2017-05-12 DIAGNOSIS — J449 Chronic obstructive pulmonary disease, unspecified: Secondary | ICD-10-CM | POA: Diagnosis not present

## 2017-05-12 DIAGNOSIS — E1122 Type 2 diabetes mellitus with diabetic chronic kidney disease: Secondary | ICD-10-CM | POA: Diagnosis not present

## 2017-05-12 DIAGNOSIS — R0602 Shortness of breath: Secondary | ICD-10-CM | POA: Diagnosis not present

## 2017-05-12 DIAGNOSIS — E782 Mixed hyperlipidemia: Secondary | ICD-10-CM | POA: Diagnosis not present

## 2017-05-12 DIAGNOSIS — K210 Gastro-esophageal reflux disease with esophagitis: Secondary | ICD-10-CM | POA: Diagnosis not present

## 2017-05-12 DIAGNOSIS — E1165 Type 2 diabetes mellitus with hyperglycemia: Secondary | ICD-10-CM | POA: Diagnosis not present

## 2017-05-25 DIAGNOSIS — S82252D Displaced comminuted fracture of shaft of left tibia, subsequent encounter for closed fracture with routine healing: Secondary | ICD-10-CM | POA: Diagnosis not present

## 2017-06-04 DIAGNOSIS — E6609 Other obesity due to excess calories: Secondary | ICD-10-CM | POA: Diagnosis not present

## 2017-06-04 DIAGNOSIS — E1122 Type 2 diabetes mellitus with diabetic chronic kidney disease: Secondary | ICD-10-CM | POA: Diagnosis not present

## 2017-06-04 DIAGNOSIS — F17210 Nicotine dependence, cigarettes, uncomplicated: Secondary | ICD-10-CM | POA: Diagnosis not present

## 2017-06-04 DIAGNOSIS — G4733 Obstructive sleep apnea (adult) (pediatric): Secondary | ICD-10-CM | POA: Diagnosis not present

## 2017-06-04 DIAGNOSIS — G8194 Hemiplegia, unspecified affecting left nondominant side: Secondary | ICD-10-CM | POA: Diagnosis not present

## 2017-06-04 DIAGNOSIS — E782 Mixed hyperlipidemia: Secondary | ICD-10-CM | POA: Diagnosis not present

## 2017-06-04 DIAGNOSIS — F331 Major depressive disorder, recurrent, moderate: Secondary | ICD-10-CM | POA: Diagnosis not present

## 2017-06-04 DIAGNOSIS — E1142 Type 2 diabetes mellitus with diabetic polyneuropathy: Secondary | ICD-10-CM | POA: Diagnosis not present

## 2017-06-11 DIAGNOSIS — S82202B Unspecified fracture of shaft of left tibia, initial encounter for open fracture type I or II: Secondary | ICD-10-CM | POA: Diagnosis not present

## 2017-06-11 DIAGNOSIS — R0902 Hypoxemia: Secondary | ICD-10-CM | POA: Diagnosis not present

## 2017-06-11 DIAGNOSIS — R0602 Shortness of breath: Secondary | ICD-10-CM | POA: Diagnosis not present

## 2017-06-11 DIAGNOSIS — S82402B Unspecified fracture of shaft of left fibula, initial encounter for open fracture type I or II: Secondary | ICD-10-CM | POA: Diagnosis not present

## 2017-06-11 DIAGNOSIS — R05 Cough: Secondary | ICD-10-CM | POA: Diagnosis not present

## 2017-06-11 DIAGNOSIS — J449 Chronic obstructive pulmonary disease, unspecified: Secondary | ICD-10-CM | POA: Diagnosis not present

## 2017-06-14 DIAGNOSIS — E1122 Type 2 diabetes mellitus with diabetic chronic kidney disease: Secondary | ICD-10-CM | POA: Diagnosis not present

## 2017-06-14 DIAGNOSIS — K210 Gastro-esophageal reflux disease with esophagitis: Secondary | ICD-10-CM | POA: Diagnosis not present

## 2017-06-14 DIAGNOSIS — E876 Hypokalemia: Secondary | ICD-10-CM | POA: Diagnosis not present

## 2017-06-14 DIAGNOSIS — N184 Chronic kidney disease, stage 4 (severe): Secondary | ICD-10-CM | POA: Diagnosis not present

## 2017-06-14 DIAGNOSIS — J440 Chronic obstructive pulmonary disease with acute lower respiratory infection: Secondary | ICD-10-CM | POA: Diagnosis not present

## 2017-06-22 DIAGNOSIS — S82252D Displaced comminuted fracture of shaft of left tibia, subsequent encounter for closed fracture with routine healing: Secondary | ICD-10-CM | POA: Diagnosis not present

## 2017-07-08 DIAGNOSIS — J159 Unspecified bacterial pneumonia: Secondary | ICD-10-CM | POA: Diagnosis not present

## 2017-07-08 DIAGNOSIS — N184 Chronic kidney disease, stage 4 (severe): Secondary | ICD-10-CM | POA: Diagnosis not present

## 2017-07-08 DIAGNOSIS — J440 Chronic obstructive pulmonary disease with acute lower respiratory infection: Secondary | ICD-10-CM | POA: Diagnosis not present

## 2017-07-08 DIAGNOSIS — I10 Essential (primary) hypertension: Secondary | ICD-10-CM | POA: Diagnosis not present

## 2017-07-08 DIAGNOSIS — F17210 Nicotine dependence, cigarettes, uncomplicated: Secondary | ICD-10-CM | POA: Diagnosis not present

## 2017-07-08 DIAGNOSIS — E039 Hypothyroidism, unspecified: Secondary | ICD-10-CM | POA: Diagnosis not present

## 2017-07-08 DIAGNOSIS — Z6833 Body mass index (BMI) 33.0-33.9, adult: Secondary | ICD-10-CM | POA: Diagnosis not present

## 2017-07-12 DIAGNOSIS — S82202B Unspecified fracture of shaft of left tibia, initial encounter for open fracture type I or II: Secondary | ICD-10-CM | POA: Diagnosis not present

## 2017-07-12 DIAGNOSIS — S82402B Unspecified fracture of shaft of left fibula, initial encounter for open fracture type I or II: Secondary | ICD-10-CM | POA: Diagnosis not present

## 2017-07-12 DIAGNOSIS — R0902 Hypoxemia: Secondary | ICD-10-CM | POA: Diagnosis not present

## 2017-07-12 DIAGNOSIS — R0602 Shortness of breath: Secondary | ICD-10-CM | POA: Diagnosis not present

## 2017-07-12 DIAGNOSIS — J449 Chronic obstructive pulmonary disease, unspecified: Secondary | ICD-10-CM | POA: Diagnosis not present

## 2017-07-12 DIAGNOSIS — R05 Cough: Secondary | ICD-10-CM | POA: Diagnosis not present

## 2017-08-11 DIAGNOSIS — J449 Chronic obstructive pulmonary disease, unspecified: Secondary | ICD-10-CM | POA: Diagnosis not present

## 2017-08-11 DIAGNOSIS — R0602 Shortness of breath: Secondary | ICD-10-CM | POA: Diagnosis not present

## 2017-08-11 DIAGNOSIS — R05 Cough: Secondary | ICD-10-CM | POA: Diagnosis not present

## 2017-08-11 DIAGNOSIS — S82202B Unspecified fracture of shaft of left tibia, initial encounter for open fracture type I or II: Secondary | ICD-10-CM | POA: Diagnosis not present

## 2017-08-11 DIAGNOSIS — S82402B Unspecified fracture of shaft of left fibula, initial encounter for open fracture type I or II: Secondary | ICD-10-CM | POA: Diagnosis not present

## 2017-08-11 DIAGNOSIS — R0902 Hypoxemia: Secondary | ICD-10-CM | POA: Diagnosis not present

## 2017-08-24 DIAGNOSIS — I10 Essential (primary) hypertension: Secondary | ICD-10-CM | POA: Diagnosis not present

## 2017-08-24 DIAGNOSIS — E1122 Type 2 diabetes mellitus with diabetic chronic kidney disease: Secondary | ICD-10-CM | POA: Diagnosis not present

## 2017-08-24 DIAGNOSIS — Z6833 Body mass index (BMI) 33.0-33.9, adult: Secondary | ICD-10-CM | POA: Diagnosis not present

## 2017-08-24 DIAGNOSIS — G8194 Hemiplegia, unspecified affecting left nondominant side: Secondary | ICD-10-CM | POA: Diagnosis not present

## 2017-08-24 DIAGNOSIS — Z1389 Encounter for screening for other disorder: Secondary | ICD-10-CM | POA: Diagnosis not present

## 2017-08-24 DIAGNOSIS — E1142 Type 2 diabetes mellitus with diabetic polyneuropathy: Secondary | ICD-10-CM | POA: Diagnosis not present

## 2017-08-24 DIAGNOSIS — E782 Mixed hyperlipidemia: Secondary | ICD-10-CM | POA: Diagnosis not present

## 2017-08-24 DIAGNOSIS — Z1331 Encounter for screening for depression: Secondary | ICD-10-CM | POA: Diagnosis not present

## 2017-09-05 DIAGNOSIS — E876 Hypokalemia: Secondary | ICD-10-CM | POA: Diagnosis not present

## 2017-09-05 DIAGNOSIS — R0902 Hypoxemia: Secondary | ICD-10-CM | POA: Diagnosis not present

## 2017-09-05 DIAGNOSIS — Z720 Tobacco use: Secondary | ICD-10-CM | POA: Diagnosis not present

## 2017-09-05 DIAGNOSIS — J440 Chronic obstructive pulmonary disease with acute lower respiratory infection: Secondary | ICD-10-CM | POA: Diagnosis not present

## 2017-09-05 DIAGNOSIS — J9621 Acute and chronic respiratory failure with hypoxia: Secondary | ICD-10-CM | POA: Diagnosis not present

## 2017-09-05 DIAGNOSIS — J9602 Acute respiratory failure with hypercapnia: Secondary | ICD-10-CM | POA: Diagnosis not present

## 2017-09-05 DIAGNOSIS — F3340 Major depressive disorder, recurrent, in remission, unspecified: Secondary | ICD-10-CM | POA: Diagnosis not present

## 2017-09-05 DIAGNOSIS — I2781 Cor pulmonale (chronic): Secondary | ICD-10-CM | POA: Diagnosis not present

## 2017-09-05 DIAGNOSIS — J441 Chronic obstructive pulmonary disease with (acute) exacerbation: Secondary | ICD-10-CM | POA: Diagnosis not present

## 2017-09-05 DIAGNOSIS — R7989 Other specified abnormal findings of blood chemistry: Secondary | ICD-10-CM | POA: Diagnosis not present

## 2017-09-05 DIAGNOSIS — E871 Hypo-osmolality and hyponatremia: Secondary | ICD-10-CM | POA: Diagnosis not present

## 2017-09-05 DIAGNOSIS — I5033 Acute on chronic diastolic (congestive) heart failure: Secondary | ICD-10-CM | POA: Diagnosis not present

## 2017-09-05 DIAGNOSIS — J189 Pneumonia, unspecified organism: Secondary | ICD-10-CM | POA: Diagnosis not present

## 2017-09-05 DIAGNOSIS — N184 Chronic kidney disease, stage 4 (severe): Secondary | ICD-10-CM | POA: Diagnosis not present

## 2017-09-05 DIAGNOSIS — J9601 Acute respiratory failure with hypoxia: Secondary | ICD-10-CM | POA: Diagnosis not present

## 2017-09-05 DIAGNOSIS — R0602 Shortness of breath: Secondary | ICD-10-CM | POA: Diagnosis not present

## 2017-09-07 ENCOUNTER — Encounter (INDEPENDENT_AMBULATORY_CARE_PROVIDER_SITE_OTHER): Payer: Medicare Other | Admitting: Ophthalmology

## 2017-09-11 DIAGNOSIS — S82402B Unspecified fracture of shaft of left fibula, initial encounter for open fracture type I or II: Secondary | ICD-10-CM | POA: Diagnosis not present

## 2017-09-11 DIAGNOSIS — R0602 Shortness of breath: Secondary | ICD-10-CM | POA: Diagnosis not present

## 2017-09-11 DIAGNOSIS — R05 Cough: Secondary | ICD-10-CM | POA: Diagnosis not present

## 2017-09-11 DIAGNOSIS — R0902 Hypoxemia: Secondary | ICD-10-CM | POA: Diagnosis not present

## 2017-09-11 DIAGNOSIS — S82202B Unspecified fracture of shaft of left tibia, initial encounter for open fracture type I or II: Secondary | ICD-10-CM | POA: Diagnosis not present

## 2017-09-11 DIAGNOSIS — J449 Chronic obstructive pulmonary disease, unspecified: Secondary | ICD-10-CM | POA: Diagnosis not present
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
## 2017-09-20 ENCOUNTER — Encounter (INDEPENDENT_AMBULATORY_CARE_PROVIDER_SITE_OTHER): Payer: Self-pay | Admitting: Ophthalmology

## 2017-09-20 ENCOUNTER — Ambulatory Visit (INDEPENDENT_AMBULATORY_CARE_PROVIDER_SITE_OTHER): Payer: Medicare HMO | Admitting: Ophthalmology

## 2017-09-20 DIAGNOSIS — H353231 Exudative age-related macular degeneration, bilateral, with active choroidal neovascularization: Secondary | ICD-10-CM

## 2017-09-20 DIAGNOSIS — I10 Essential (primary) hypertension: Secondary | ICD-10-CM

## 2017-09-20 DIAGNOSIS — H3581 Retinal edema: Secondary | ICD-10-CM

## 2017-09-20 DIAGNOSIS — E113393 Type 2 diabetes mellitus with moderate nonproliferative diabetic retinopathy without macular edema, bilateral: Secondary | ICD-10-CM

## 2017-09-20 DIAGNOSIS — H25813 Combined forms of age-related cataract, bilateral: Secondary | ICD-10-CM

## 2017-09-20 DIAGNOSIS — H35033 Hypertensive retinopathy, bilateral: Secondary | ICD-10-CM | POA: Diagnosis not present

## 2017-09-20 MED ORDER — BEVACIZUMAB CHEMO INJECTION 1.25MG/0.05ML SYRINGE FOR KALEIDOSCOPE
1.2500 mg | INTRAVITREAL | Status: AC
  Administered 2017-09-20: 1.25 mg via INTRAVITREAL

## 2017-09-20 MED ORDER — AFLIBERCEPT 2MG/0.05ML IZ SOLN FOR KALEIDOSCOPE
2.0000 mg | INTRAVITREAL | Status: AC
  Administered 2017-09-20: 2 mg via INTRAVITREAL

## 2017-09-23 DIAGNOSIS — I714 Abdominal aortic aneurysm, without rupture: Secondary | ICD-10-CM | POA: Diagnosis not present

## 2017-10-02 DIAGNOSIS — E876 Hypokalemia: Secondary | ICD-10-CM | POA: Diagnosis not present

## 2017-10-02 DIAGNOSIS — R0602 Shortness of breath: Secondary | ICD-10-CM | POA: Diagnosis not present

## 2017-10-02 DIAGNOSIS — I2781 Cor pulmonale (chronic): Secondary | ICD-10-CM | POA: Diagnosis not present

## 2017-10-02 DIAGNOSIS — E871 Hypo-osmolality and hyponatremia: Secondary | ICD-10-CM | POA: Diagnosis not present

## 2017-10-02 DIAGNOSIS — E1122 Type 2 diabetes mellitus with diabetic chronic kidney disease: Secondary | ICD-10-CM | POA: Diagnosis not present

## 2017-10-02 DIAGNOSIS — J9611 Chronic respiratory failure with hypoxia: Secondary | ICD-10-CM | POA: Diagnosis not present

## 2017-10-02 DIAGNOSIS — N183 Chronic kidney disease, stage 3 (moderate): Secondary | ICD-10-CM | POA: Diagnosis not present

## 2017-10-02 DIAGNOSIS — I129 Hypertensive chronic kidney disease with stage 1 through stage 4 chronic kidney disease, or unspecified chronic kidney disease: Secondary | ICD-10-CM | POA: Diagnosis not present

## 2017-10-02 DIAGNOSIS — D631 Anemia in chronic kidney disease: Secondary | ICD-10-CM | POA: Diagnosis not present

## 2017-10-02 DIAGNOSIS — Z720 Tobacco use: Secondary | ICD-10-CM | POA: Diagnosis not present

## 2017-10-02 DIAGNOSIS — J441 Chronic obstructive pulmonary disease with (acute) exacerbation: Secondary | ICD-10-CM | POA: Diagnosis not present

## 2017-10-03 DIAGNOSIS — J9621 Acute and chronic respiratory failure with hypoxia: Secondary | ICD-10-CM | POA: Diagnosis not present

## 2017-10-03 DIAGNOSIS — J440 Chronic obstructive pulmonary disease with acute lower respiratory infection: Secondary | ICD-10-CM | POA: Diagnosis not present

## 2017-10-12 DIAGNOSIS — S82202B Unspecified fracture of shaft of left tibia, initial encounter for open fracture type I or II: Secondary | ICD-10-CM | POA: Diagnosis not present

## 2017-10-12 DIAGNOSIS — S82402B Unspecified fracture of shaft of left fibula, initial encounter for open fracture type I or II: Secondary | ICD-10-CM | POA: Diagnosis not present

## 2017-10-12 DIAGNOSIS — R05 Cough: Secondary | ICD-10-CM | POA: Diagnosis not present

## 2017-10-12 DIAGNOSIS — R0602 Shortness of breath: Secondary | ICD-10-CM | POA: Diagnosis not present

## 2017-10-12 DIAGNOSIS — J449 Chronic obstructive pulmonary disease, unspecified: Secondary | ICD-10-CM | POA: Diagnosis not present

## 2017-10-12 DIAGNOSIS — R0902 Hypoxemia: Secondary | ICD-10-CM | POA: Diagnosis not present
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
## 2017-10-18 ENCOUNTER — Ambulatory Visit (INDEPENDENT_AMBULATORY_CARE_PROVIDER_SITE_OTHER): Payer: Medicare HMO | Admitting: Ophthalmology

## 2017-10-18 ENCOUNTER — Encounter (INDEPENDENT_AMBULATORY_CARE_PROVIDER_SITE_OTHER): Payer: Self-pay | Admitting: Ophthalmology

## 2017-10-18 DIAGNOSIS — H353231 Exudative age-related macular degeneration, bilateral, with active choroidal neovascularization: Secondary | ICD-10-CM

## 2017-10-18 DIAGNOSIS — H35033 Hypertensive retinopathy, bilateral: Secondary | ICD-10-CM | POA: Diagnosis not present

## 2017-10-18 DIAGNOSIS — H3581 Retinal edema: Secondary | ICD-10-CM | POA: Diagnosis not present

## 2017-10-18 DIAGNOSIS — I10 Essential (primary) hypertension: Secondary | ICD-10-CM | POA: Diagnosis not present

## 2017-10-18 DIAGNOSIS — H25813 Combined forms of age-related cataract, bilateral: Secondary | ICD-10-CM

## 2017-10-18 DIAGNOSIS — E113393 Type 2 diabetes mellitus with moderate nonproliferative diabetic retinopathy without macular edema, bilateral: Secondary | ICD-10-CM

## 2017-10-19 ENCOUNTER — Encounter (INDEPENDENT_AMBULATORY_CARE_PROVIDER_SITE_OTHER): Payer: Self-pay | Admitting: Ophthalmology

## 2017-10-19 DIAGNOSIS — I10 Essential (primary) hypertension: Secondary | ICD-10-CM | POA: Diagnosis not present

## 2017-10-19 DIAGNOSIS — H25813 Combined forms of age-related cataract, bilateral: Secondary | ICD-10-CM | POA: Diagnosis not present

## 2017-10-19 DIAGNOSIS — Z23 Encounter for immunization: Secondary | ICD-10-CM | POA: Diagnosis not present

## 2017-10-19 DIAGNOSIS — H3581 Retinal edema: Secondary | ICD-10-CM | POA: Diagnosis not present

## 2017-10-19 DIAGNOSIS — H353231 Exudative age-related macular degeneration, bilateral, with active choroidal neovascularization: Secondary | ICD-10-CM | POA: Diagnosis not present

## 2017-10-19 DIAGNOSIS — H35033 Hypertensive retinopathy, bilateral: Secondary | ICD-10-CM | POA: Diagnosis not present

## 2017-10-19 DIAGNOSIS — E1165 Type 2 diabetes mellitus with hyperglycemia: Secondary | ICD-10-CM | POA: Diagnosis not present

## 2017-10-19 DIAGNOSIS — E113393 Type 2 diabetes mellitus with moderate nonproliferative diabetic retinopathy without macular edema, bilateral: Secondary | ICD-10-CM | POA: Diagnosis not present

## 2017-10-19 MED ORDER — AFLIBERCEPT 2MG/0.05ML IZ SOLN FOR KALEIDOSCOPE
2.0000 mg | INTRAVITREAL | Status: AC
Start: 2017-10-19 — End: ?
  Administered 2017-10-19: 2 mg via INTRAVITREAL

## 2017-10-19 MED ORDER — BEVACIZUMAB CHEMO INJECTION 1.25MG/0.05ML SYRINGE FOR KALEIDOSCOPE
1.2500 mg | INTRAVITREAL | Status: AC
  Administered 2017-10-19: 1.25 mg via INTRAVITREAL

## 2017-11-03 DIAGNOSIS — J440 Chronic obstructive pulmonary disease with acute lower respiratory infection: Secondary | ICD-10-CM | POA: Diagnosis not present

## 2017-11-03 DIAGNOSIS — J9611 Chronic respiratory failure with hypoxia: Secondary | ICD-10-CM | POA: Diagnosis not present

## 2017-11-03 DIAGNOSIS — Z6831 Body mass index (BMI) 31.0-31.9, adult: Secondary | ICD-10-CM | POA: Diagnosis not present

## 2017-11-11 DIAGNOSIS — R0602 Shortness of breath: Secondary | ICD-10-CM | POA: Diagnosis not present

## 2017-11-11 DIAGNOSIS — S82402B Unspecified fracture of shaft of left fibula, initial encounter for open fracture type I or II: Secondary | ICD-10-CM | POA: Diagnosis not present

## 2017-11-11 DIAGNOSIS — R0902 Hypoxemia: Secondary | ICD-10-CM | POA: Diagnosis not present

## 2017-11-11 DIAGNOSIS — R05 Cough: Secondary | ICD-10-CM | POA: Diagnosis not present

## 2017-11-11 DIAGNOSIS — J449 Chronic obstructive pulmonary disease, unspecified: Secondary | ICD-10-CM | POA: Diagnosis not present

## 2017-11-11 DIAGNOSIS — S82202B Unspecified fracture of shaft of left tibia, initial encounter for open fracture type I or II: Secondary | ICD-10-CM | POA: Diagnosis not present
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
## 2017-11-15 ENCOUNTER — Encounter (INDEPENDENT_AMBULATORY_CARE_PROVIDER_SITE_OTHER): Payer: Self-pay | Admitting: Ophthalmology

## 2017-11-15 ENCOUNTER — Ambulatory Visit (INDEPENDENT_AMBULATORY_CARE_PROVIDER_SITE_OTHER): Payer: Medicare HMO | Admitting: Ophthalmology

## 2017-11-15 DIAGNOSIS — H25813 Combined forms of age-related cataract, bilateral: Secondary | ICD-10-CM

## 2017-11-15 DIAGNOSIS — H3581 Retinal edema: Secondary | ICD-10-CM

## 2017-11-15 DIAGNOSIS — E113393 Type 2 diabetes mellitus with moderate nonproliferative diabetic retinopathy without macular edema, bilateral: Secondary | ICD-10-CM

## 2017-11-15 DIAGNOSIS — H353231 Exudative age-related macular degeneration, bilateral, with active choroidal neovascularization: Secondary | ICD-10-CM

## 2017-11-15 DIAGNOSIS — I10 Essential (primary) hypertension: Secondary | ICD-10-CM

## 2017-11-15 DIAGNOSIS — H35033 Hypertensive retinopathy, bilateral: Secondary | ICD-10-CM

## 2017-11-15 MED ORDER — AFLIBERCEPT 2MG/0.05ML IZ SOLN FOR KALEIDOSCOPE
2.0000 mg | INTRAVITREAL | Status: AC
  Administered 2017-11-15: 2 mg via INTRAVITREAL

## 2017-11-15 MED ORDER — BEVACIZUMAB CHEMO INJECTION 1.25MG/0.05ML SYRINGE FOR KALEIDOSCOPE
1.2500 mg | INTRAVITREAL | Status: AC
  Administered 2017-11-15: 1.25 mg via INTRAVITREAL

## 2017-11-20 ENCOUNTER — Encounter (INDEPENDENT_AMBULATORY_CARE_PROVIDER_SITE_OTHER): Payer: Self-pay | Admitting: Ophthalmology

## 2017-11-28 ENCOUNTER — Encounter: Payer: Self-pay | Admitting: Vascular Surgery

## 2017-11-28 ENCOUNTER — Ambulatory Visit: Payer: Medicare HMO | Admitting: Vascular Surgery

## 2017-11-28 VITALS — BP 143/74 | HR 79 | Temp 98.2°F | Resp 20 | Ht 66.0 in | Wt 219.6 lb

## 2017-11-28 DIAGNOSIS — I714 Abdominal aortic aneurysm, without rupture, unspecified: Secondary | ICD-10-CM

## 2017-11-28 NOTE — Progress Notes (Signed)
[facility] [place]  Patient name: Paul [name] MRN: [id number] DOB: October 20, 1955 Sex: male  REASON FOR CONSULT: Evaluation of abdominal aortic aneurysm  Seen today in our [place] office  HPI: Paul [name] is a 62 y.o. male, who is here today for follow-up of abdominal aortic aneurysm.  He is here today with his daughter who is a Marine scientist at [employer].  Have a known history of aneurysm and this is been followed with both CT and ultrasound.  His last visit in our office was in 2014 with Dr. Trula Slade.  At that time been recommended that he undergo a CT scan for further follow-up.  He has had serial ultrasounds with some variation in size prediction.  He is obese making ultrasound somewhat challenging.  He does have documentation of maximal diameter of 4.5 cm by CT scan in 2013.  Ultrasound since that time is varied from the upper 3 cm to most recently at 4.5 cm by ultrasound.  Has no symptoms referable to this.  Does have a history of grandmother dying of ruptured aneurysm.  He does have extremely severe COPD and continues to smoke despite this.  Is unable to walk to the parking lot without stopping due to his severe shortness of breath.  Past Medical History:  Diagnosis Date  . Abdominal aortic aneurysm (HCC)     4.5 cm by CT 12/3  . Anemia   . Cardiomyopathy ([place])     LVEF 45-50% by echocardiiogrram 12/2  . Cataract   . COPD (chronic obstructive pulmonary disease) ([place])   . Depression   . Essential hypertension, benign   . GERD (gastroesophageal reflux disease)   . Hypertensive retinopathy   . Irregular heart beat   . Lung disease   . Mitral regurgitation     Mild to moderate  . Mixed hyperlipidemia   . Obstructive sleep apnea   . Peripheral neuropathy   . Stroke ([place])   . Tibia/fibula fracture, shaft, left, open type I or II, initial encounter 04/10/2017  . Type 2 diabetes mellitus (HCC)     Family History    Problem Relation Age of Onset  . Heart disease Father        Heart Disease before age 88  . Diabetes Father   . Hyperlipidemia Father   . Hypertension Father   . Diabetes Mother   . Hyperlipidemia Mother   . Hypertension Mother   . Heart disease Mother        Heart Disease before age 23  . Diabetes Sister   . Hypertension Sister     SOCIAL HISTORY: Social History   Socioeconomic History  . Marital status: Married    Spouse name: Not on file  . Number of children: Not on file  . Years of education: Not on file  . Highest education level: Not on file  Occupational History  . Not on file  Social Needs  . Financial resource strain: Not on file  . Food insecurity:    Worry: Not on file    Inability: Not on file  . Transportation needs:    Medical: Not on file    Non-medical: Not on file  Tobacco Use  . Smoking status: Current Every Day Smoker    Packs/day: 0.30    Years: 52.00    Pack years: 15.60    Types: Cigarettes    Start date: 02/09/1959  . Smokeless tobacco: Former Systems developer    Types: Loss adjuster, chartered  Quit date: 02/09/1979  . Tobacco comment: chewed for 2 years  Substance and Sexual Activity  . Alcohol use: No    Comment: Prior history of regular alcohol use  . Drug use: No  . Sexual activity: Not on file  Lifestyle  . Physical activity:    Days per week: Not on file    Minutes per session: Not on file  . Stress: Not on file  Relationships  . Social connections:    Talks on phone: Not on file    Gets together: Not on file    Attends religious service: Not on file    Active member of club or organization: Not on file    Attends meetings of clubs or organizations: Not on file    Relationship status: Not on file  . Intimate partner violence:    Fear of current or ex partner: Not on file    Emotionally abused: Not on file    Physically abused: Not on file    Forced sexual activity: Not on file  Other Topics Concern  . Not on file  Social History Narrative  . Not  on file    No Known Allergies  Current Outpatient Medications  Medication Sig Dispense Refill  . ADVAIR DISKUS 250-50 MCG/DOSE AEPB Inhale 1 puff into the lungs Twice daily.    Marland Kitchen amLODipine (NORVASC) 10 MG tablet Take 10 mg by mouth daily.    Marland Kitchen atorvastatin (LIPITOR) 80 MG tablet Take 80 mg by mouth daily.    . Blood Glucose Calibration (TRUE METRIX LEVEL 1) Low SOLN     . buPROPion (WELLBUTRIN XL) 150 MG 24 hr tablet Take 150 mg by mouth daily. For depression  1  . clonazePAM (KLONOPIN) 1 MG tablet Take 1 mg by mouth at bedtime.    . clopidogrel (PLAVIX) 75 MG tablet Take 75 mg by mouth daily.    . furosemide (LASIX) 40 MG tablet Take 1 tablet (40 mg total) by mouth 2 (two) times daily. 30 tablet 0  . gabapentin (NEURONTIN) 600 MG tablet Take 1 tablet (600 mg total) by mouth 2 (two) times daily. 60 tablet 0  . glipiZIDE (GLUCOTROL XL) 5 MG 24 hr tablet     . iron polysaccharides (NU-IRON) 150 MG capsule Take 150 mg by mouth 2 (two) times daily.    Marland Kitchen levothyroxine (SYNTHROID, LEVOTHROID) 50 MCG tablet Take 50 mcg by mouth Daily.     . montelukast (SINGULAIR) 10 MG tablet Take 10 mg by mouth Daily.     . pantoprazole (PROTONIX) 40 MG tablet Take 40 mg by mouth 2 (two) times daily.     . potassium chloride SA (K-DUR,KLOR-CON) 20 MEQ tablet Take 20 mEq by mouth daily.    . potassium chloride SA (K-DUR,KLOR-CON) 20 MEQ tablet Take 20 mEq by mouth once.    Marland Kitchen SPIRIVA HANDIHALER 18 MCG inhalation capsule Place 1 puff into inhaler and inhale Daily.    . Tamsulosin HCl (FLOMAX) 0.4 MG CAPS Take 0.4 mg by mouth daily.     Marland Kitchen venlafaxine XR (EFFEXOR-XR) 150 MG 24 hr capsule Take 1 tablet by mouth Daily.    . VENTOLIN HFA 108 (90 BASE) MCG/ACT inhaler Inhale 2 puffs into the lungs Every 4 hours as needed. For shortness of breath     Current Facility-Administered Medications  Medication Dose Route Frequency Provider Last Rate Last Dose  . aflibercept (EYLEA) SOLN 2 mg  2 mg Intravitreal  Bernarda Caffey, MD   2  mg at 09/20/17 1557  . aflibercept (EYLEA) SOLN 2 mg  2 mg Intravitreal  Bernarda Caffey, MD   2 mg at 10/19/17 2317  . aflibercept (EYLEA) SOLN 2 mg  2 mg Intravitreal  Bernarda Caffey, MD   2 mg at 11/15/17 1450  . Bevacizumab (AVASTIN) SOLN 1.25 mg  1.25 mg Intravitreal  Bernarda Caffey, MD   1.25 mg at 09/20/17 1556  . Bevacizumab (AVASTIN) SOLN 1.25 mg  1.25 mg Intravitreal  Bernarda Caffey, MD   1.25 mg at 10/19/17 2317  . Bevacizumab (AVASTIN) SOLN 1.25 mg  1.25 mg Intravitreal  Bernarda Caffey, MD   1.25 mg at 11/15/17 1450    REVIEW OF SYSTEMS:  [X]  denotes positive finding, [ ]  denotes negative finding Cardiac  Comments:  Chest pain or chest pressure:    Shortness of breath upon exertion: x   Short of breath when lying flat: x   Irregular heart rhythm:        Vascular    Pain in calf, thigh, or hip brought on by ambulation:    Pain in feet at night that wakes you up from your sleep:     Blood clot in your veins:    Leg swelling:  x       Pulmonary    Oxygen at home: x   Productive cough:     Wheezing:  x       Neurologic    Sudden weakness in arms or legs:     Sudden numbness in arms or legs:     Sudden onset of difficulty speaking or slurred speech:    Temporary loss of vision in one eye:     Problems with dizziness:  x       Gastrointestinal    Blood in stool:     Vomited blood:         Genitourinary    Burning when urinating:     Blood in urine:        Psychiatric    Major depression:  x       Hematologic    Bleeding problems:    Problems with blood clotting too easily:        Skin    Rashes or ulcers:        Constitutional    Fever or chills:      PHYSICAL EXAM: Vitals:   11/28/17 1013  BP: (!) 143/74  Pulse: 79  Resp: 20  Temp: 98.2 F (36.8 C)  TempSrc: Temporal  Weight: 219 lb 9.6 oz (99.6 kg)  Height: 5\' 6"  (1.676 m)    GENERAL: The patient is a well-nourished male, in no acute distress. The vital signs are documented above.   Looks older r than his stated age of 5 CARDIOVASCULAR: Carotid arteries without bruits bilaterally.  2+ radial pulses.  I do not palpate popliteal pulses or aneurysm. PULMONARY: There is good air exchange  ABDOMEN: Soft and non-tender.  Obese.  I do not palpate an aneurysm MUSCULOSKELETAL: There are no major deformities or cyanosis. NEUROLOGIC: No focal weakness or paresthesias are detected. SKIN: There are no ulcers or rashes noted. PSYCHIATRIC: The patient has a normal affect.  DATA:  Ultrasound from 09/23/2017 shows maximal diameter of 4.5 cm of his aneurysm.  The scan in 2013 showed similar size  MEDICAL ISSUES: Discussed the significance of this and the limitation of ultrasound.  I do not feel that he has had any evidence of significant enlargement.  I have  recommended CT scan since that 6 years and would recommend this and follow-up in 6 months.  We will obtain the CT scan in the evening and then have the patient follow-up with Dr. Trula Slade in 6 months.  I did review symptoms of leaking aneurysm and the patient knows to report immediately to the emergency room should this occur   Rosetta Posner, MD [facility] Tel [phone] Pager [phone]

## 2017-11-29 ENCOUNTER — Encounter: Payer: Medicare Other | Admitting: Vascular Surgery

## 2017-11-29 ENCOUNTER — Other Ambulatory Visit: Payer: Self-pay

## 2017-11-29 DIAGNOSIS — I714 Abdominal aortic aneurysm, without rupture, unspecified: Secondary | ICD-10-CM

## 2017-11-30 ENCOUNTER — Encounter: Payer: Self-pay | Admitting: Family Medicine

## 2017-12-09 DIAGNOSIS — E1122 Type 2 diabetes mellitus with diabetic chronic kidney disease: Secondary | ICD-10-CM | POA: Diagnosis not present

## 2017-12-09 DIAGNOSIS — G4733 Obstructive sleep apnea (adult) (pediatric): Secondary | ICD-10-CM | POA: Diagnosis not present

## 2017-12-09 DIAGNOSIS — E1142 Type 2 diabetes mellitus with diabetic polyneuropathy: Secondary | ICD-10-CM | POA: Diagnosis not present

## 2017-12-09 DIAGNOSIS — F331 Major depressive disorder, recurrent, moderate: Secondary | ICD-10-CM | POA: Diagnosis not present

## 2017-12-09 DIAGNOSIS — Z23 Encounter for immunization: Secondary | ICD-10-CM | POA: Diagnosis not present

## 2017-12-09 DIAGNOSIS — Z6833 Body mass index (BMI) 33.0-33.9, adult: Secondary | ICD-10-CM | POA: Diagnosis not present

## 2017-12-09 DIAGNOSIS — I10 Essential (primary) hypertension: Secondary | ICD-10-CM | POA: Diagnosis not present

## 2017-12-09 DIAGNOSIS — G8194 Hemiplegia, unspecified affecting left nondominant side: Secondary | ICD-10-CM | POA: Diagnosis not present

## 2017-12-09 DIAGNOSIS — F17210 Nicotine dependence, cigarettes, uncomplicated: Secondary | ICD-10-CM | POA: Diagnosis not present

## 2017-12-09 DIAGNOSIS — Z0001 Encounter for general adult medical examination with abnormal findings: Secondary | ICD-10-CM | POA: Diagnosis not present

## 2017-12-09 DIAGNOSIS — E6609 Other obesity due to excess calories: Secondary | ICD-10-CM | POA: Diagnosis not present

## 2017-12-09 DIAGNOSIS — E782 Mixed hyperlipidemia: Secondary | ICD-10-CM | POA: Diagnosis not present

## 2017-12-12 DIAGNOSIS — S82202B Unspecified fracture of shaft of left tibia, initial encounter for open fracture type I or II: Secondary | ICD-10-CM | POA: Diagnosis not present

## 2017-12-12 DIAGNOSIS — R05 Cough: Secondary | ICD-10-CM | POA: Diagnosis not present

## 2017-12-12 DIAGNOSIS — R0602 Shortness of breath: Secondary | ICD-10-CM | POA: Diagnosis not present

## 2017-12-12 DIAGNOSIS — S82402B Unspecified fracture of shaft of left fibula, initial encounter for open fracture type I or II: Secondary | ICD-10-CM | POA: Diagnosis not present

## 2017-12-12 DIAGNOSIS — J449 Chronic obstructive pulmonary disease, unspecified: Secondary | ICD-10-CM | POA: Diagnosis not present

## 2017-12-12 DIAGNOSIS — R0902 Hypoxemia: Secondary | ICD-10-CM | POA: Diagnosis not present
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
## 2017-12-13 ENCOUNTER — Ambulatory Visit (INDEPENDENT_AMBULATORY_CARE_PROVIDER_SITE_OTHER): Payer: Medicare HMO | Admitting: Ophthalmology

## 2017-12-13 ENCOUNTER — Encounter (INDEPENDENT_AMBULATORY_CARE_PROVIDER_SITE_OTHER): Payer: Self-pay | Admitting: Ophthalmology

## 2017-12-13 DIAGNOSIS — H35033 Hypertensive retinopathy, bilateral: Secondary | ICD-10-CM

## 2017-12-13 DIAGNOSIS — I10 Essential (primary) hypertension: Secondary | ICD-10-CM | POA: Diagnosis not present

## 2017-12-13 DIAGNOSIS — H353231 Exudative age-related macular degeneration, bilateral, with active choroidal neovascularization: Secondary | ICD-10-CM | POA: Diagnosis not present

## 2017-12-13 DIAGNOSIS — H3581 Retinal edema: Secondary | ICD-10-CM | POA: Diagnosis not present

## 2017-12-13 DIAGNOSIS — H25813 Combined forms of age-related cataract, bilateral: Secondary | ICD-10-CM | POA: Diagnosis not present

## 2017-12-13 DIAGNOSIS — E113393 Type 2 diabetes mellitus with moderate nonproliferative diabetic retinopathy without macular edema, bilateral: Secondary | ICD-10-CM | POA: Diagnosis not present

## 2017-12-13 MED ORDER — AFLIBERCEPT 2MG/0.05ML IZ SOLN FOR KALEIDOSCOPE
2.0000 mg | INTRAVITREAL | Status: AC
  Administered 2017-12-13: 2 mg via INTRAVITREAL

## 2018-01-07 DIAGNOSIS — I10 Essential (primary) hypertension: Secondary | ICD-10-CM | POA: Diagnosis not present

## 2018-01-07 DIAGNOSIS — E782 Mixed hyperlipidemia: Secondary | ICD-10-CM | POA: Diagnosis not present

## 2018-01-07 DIAGNOSIS — E039 Hypothyroidism, unspecified: Secondary | ICD-10-CM | POA: Diagnosis not present

## 2018-01-07 DIAGNOSIS — E1122 Type 2 diabetes mellitus with diabetic chronic kidney disease: Secondary | ICD-10-CM | POA: Diagnosis not present
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
## 2018-01-10 ENCOUNTER — Ambulatory Visit (INDEPENDENT_AMBULATORY_CARE_PROVIDER_SITE_OTHER): Payer: Medicare HMO | Admitting: Ophthalmology

## 2018-01-10 ENCOUNTER — Encounter (INDEPENDENT_AMBULATORY_CARE_PROVIDER_SITE_OTHER): Payer: Self-pay | Admitting: Ophthalmology

## 2018-01-10 DIAGNOSIS — E113393 Type 2 diabetes mellitus with moderate nonproliferative diabetic retinopathy without macular edema, bilateral: Secondary | ICD-10-CM

## 2018-01-10 DIAGNOSIS — H25813 Combined forms of age-related cataract, bilateral: Secondary | ICD-10-CM | POA: Diagnosis not present

## 2018-01-10 DIAGNOSIS — I10 Essential (primary) hypertension: Secondary | ICD-10-CM | POA: Diagnosis not present

## 2018-01-10 DIAGNOSIS — H3581 Retinal edema: Secondary | ICD-10-CM

## 2018-01-10 DIAGNOSIS — H353231 Exudative age-related macular degeneration, bilateral, with active choroidal neovascularization: Secondary | ICD-10-CM | POA: Diagnosis not present

## 2018-01-10 DIAGNOSIS — H35033 Hypertensive retinopathy, bilateral: Secondary | ICD-10-CM

## 2018-01-10 MED ORDER — AFLIBERCEPT 2MG/0.05ML IZ SOLN FOR KALEIDOSCOPE
2.0000 mg | INTRAVITREAL | Status: AC
  Administered 2018-01-10: 2 mg via INTRAVITREAL

## 2018-01-11 DIAGNOSIS — S82202B Unspecified fracture of shaft of left tibia, initial encounter for open fracture type I or II: Secondary | ICD-10-CM | POA: Diagnosis not present

## 2018-01-11 DIAGNOSIS — J449 Chronic obstructive pulmonary disease, unspecified: Secondary | ICD-10-CM | POA: Diagnosis not present

## 2018-01-11 DIAGNOSIS — S82402B Unspecified fracture of shaft of left fibula, initial encounter for open fracture type I or II: Secondary | ICD-10-CM | POA: Diagnosis not present

## 2018-01-11 DIAGNOSIS — R0602 Shortness of breath: Secondary | ICD-10-CM | POA: Diagnosis not present

## 2018-01-11 DIAGNOSIS — R0902 Hypoxemia: Secondary | ICD-10-CM | POA: Diagnosis not present

## 2018-01-11 DIAGNOSIS — R05 Cough: Secondary | ICD-10-CM | POA: Diagnosis not present

## 2018-02-11 DIAGNOSIS — R0602 Shortness of breath: Secondary | ICD-10-CM | POA: Diagnosis not present

## 2018-02-11 DIAGNOSIS — R0902 Hypoxemia: Secondary | ICD-10-CM | POA: Diagnosis not present

## 2018-02-11 DIAGNOSIS — S82402B Unspecified fracture of shaft of left fibula, initial encounter for open fracture type I or II: Secondary | ICD-10-CM | POA: Diagnosis not present

## 2018-02-11 DIAGNOSIS — R05 Cough: Secondary | ICD-10-CM | POA: Diagnosis not present

## 2018-02-11 DIAGNOSIS — S82202B Unspecified fracture of shaft of left tibia, initial encounter for open fracture type I or II: Secondary | ICD-10-CM | POA: Diagnosis not present

## 2018-02-11 DIAGNOSIS — J449 Chronic obstructive pulmonary disease, unspecified: Secondary | ICD-10-CM | POA: Diagnosis not present
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
## 2018-02-13 ENCOUNTER — Encounter (INDEPENDENT_AMBULATORY_CARE_PROVIDER_SITE_OTHER): Payer: Self-pay | Admitting: Ophthalmology

## 2018-02-13 ENCOUNTER — Ambulatory Visit (INDEPENDENT_AMBULATORY_CARE_PROVIDER_SITE_OTHER): Payer: Medicare HMO | Admitting: Ophthalmology

## 2018-02-13 DIAGNOSIS — H3581 Retinal edema: Secondary | ICD-10-CM

## 2018-02-13 DIAGNOSIS — H25813 Combined forms of age-related cataract, bilateral: Secondary | ICD-10-CM | POA: Diagnosis not present

## 2018-02-13 DIAGNOSIS — E113393 Type 2 diabetes mellitus with moderate nonproliferative diabetic retinopathy without macular edema, bilateral: Secondary | ICD-10-CM

## 2018-02-13 DIAGNOSIS — I10 Essential (primary) hypertension: Secondary | ICD-10-CM

## 2018-02-13 DIAGNOSIS — H35033 Hypertensive retinopathy, bilateral: Secondary | ICD-10-CM | POA: Diagnosis not present

## 2018-02-13 DIAGNOSIS — H353231 Exudative age-related macular degeneration, bilateral, with active choroidal neovascularization: Secondary | ICD-10-CM

## 2018-02-13 MED ORDER — AFLIBERCEPT 2MG/0.05ML IZ SOLN FOR KALEIDOSCOPE
2.0000 mg | INTRAVITREAL | Status: AC
  Administered 2018-02-13: 2 mg via INTRAVITREAL

## 2018-02-13 MED ORDER — BEVACIZUMAB CHEMO INJECTION 1.25MG/0.05ML SYRINGE FOR KALEIDOSCOPE
1.2500 mg | INTRAVITREAL | Status: AC
  Administered 2018-02-13: 1.25 mg via INTRAVITREAL

## 2018-02-15 DIAGNOSIS — H353231 Exudative age-related macular degeneration, bilateral, with active choroidal neovascularization: Secondary | ICD-10-CM | POA: Diagnosis not present

## 2018-02-15 DIAGNOSIS — E113393 Type 2 diabetes mellitus with moderate nonproliferative diabetic retinopathy without macular edema, bilateral: Secondary | ICD-10-CM | POA: Diagnosis not present

## 2018-02-15 DIAGNOSIS — H25013 Cortical age-related cataract, bilateral: Secondary | ICD-10-CM | POA: Diagnosis not present

## 2018-02-15 DIAGNOSIS — H35033 Hypertensive retinopathy, bilateral: Secondary | ICD-10-CM | POA: Diagnosis not present

## 2018-02-15 DIAGNOSIS — H2513 Age-related nuclear cataract, bilateral: Secondary | ICD-10-CM | POA: Diagnosis not present

## 2018-02-15 DIAGNOSIS — H25042 Posterior subcapsular polar age-related cataract, left eye: Secondary | ICD-10-CM | POA: Diagnosis not present

## 2018-02-15 DIAGNOSIS — H2512 Age-related nuclear cataract, left eye: Secondary | ICD-10-CM | POA: Diagnosis not present

## 2018-02-15 DIAGNOSIS — H25012 Cortical age-related cataract, left eye: Secondary | ICD-10-CM | POA: Diagnosis not present

## 2018-03-08 DIAGNOSIS — E039 Hypothyroidism, unspecified: Secondary | ICD-10-CM | POA: Diagnosis not present

## 2018-03-08 DIAGNOSIS — E1122 Type 2 diabetes mellitus with diabetic chronic kidney disease: Secondary | ICD-10-CM | POA: Diagnosis not present

## 2018-03-08 DIAGNOSIS — J440 Chronic obstructive pulmonary disease with acute lower respiratory infection: Secondary | ICD-10-CM | POA: Diagnosis not present

## 2018-03-08 DIAGNOSIS — E782 Mixed hyperlipidemia: Secondary | ICD-10-CM | POA: Diagnosis not present

## 2018-03-08 DIAGNOSIS — N184 Chronic kidney disease, stage 4 (severe): Secondary | ICD-10-CM | POA: Diagnosis not present

## 2018-03-08 DIAGNOSIS — E1165 Type 2 diabetes mellitus with hyperglycemia: Secondary | ICD-10-CM | POA: Diagnosis not present

## 2018-03-08 DIAGNOSIS — K219 Gastro-esophageal reflux disease without esophagitis: Secondary | ICD-10-CM | POA: Diagnosis not present

## 2018-03-08 DIAGNOSIS — F17210 Nicotine dependence, cigarettes, uncomplicated: Secondary | ICD-10-CM | POA: Diagnosis not present

## 2018-03-08 DIAGNOSIS — E1142 Type 2 diabetes mellitus with diabetic polyneuropathy: Secondary | ICD-10-CM | POA: Diagnosis not present
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
## 2018-03-09 DIAGNOSIS — E1122 Type 2 diabetes mellitus with diabetic chronic kidney disease: Secondary | ICD-10-CM | POA: Diagnosis not present

## 2018-03-09 DIAGNOSIS — E782 Mixed hyperlipidemia: Secondary | ICD-10-CM | POA: Diagnosis not present

## 2018-03-09 DIAGNOSIS — I10 Essential (primary) hypertension: Secondary | ICD-10-CM | POA: Diagnosis not present

## 2018-03-13 ENCOUNTER — Ambulatory Visit (INDEPENDENT_AMBULATORY_CARE_PROVIDER_SITE_OTHER): Payer: Medicare HMO | Admitting: Ophthalmology

## 2018-03-13 DIAGNOSIS — H353231 Exudative age-related macular degeneration, bilateral, with active choroidal neovascularization: Secondary | ICD-10-CM

## 2018-03-13 DIAGNOSIS — H35033 Hypertensive retinopathy, bilateral: Secondary | ICD-10-CM

## 2018-03-13 DIAGNOSIS — H3581 Retinal edema: Secondary | ICD-10-CM | POA: Diagnosis not present

## 2018-03-13 DIAGNOSIS — H25813 Combined forms of age-related cataract, bilateral: Secondary | ICD-10-CM | POA: Diagnosis not present

## 2018-03-13 DIAGNOSIS — I10 Essential (primary) hypertension: Secondary | ICD-10-CM

## 2018-03-13 DIAGNOSIS — E113393 Type 2 diabetes mellitus with moderate nonproliferative diabetic retinopathy without macular edema, bilateral: Secondary | ICD-10-CM

## 2018-03-14 ENCOUNTER — Encounter (INDEPENDENT_AMBULATORY_CARE_PROVIDER_SITE_OTHER): Payer: Self-pay | Admitting: Ophthalmology

## 2018-03-14 DIAGNOSIS — G8194 Hemiplegia, unspecified affecting left nondominant side: Secondary | ICD-10-CM | POA: Diagnosis not present

## 2018-03-14 DIAGNOSIS — J449 Chronic obstructive pulmonary disease, unspecified: Secondary | ICD-10-CM | POA: Diagnosis not present

## 2018-03-14 DIAGNOSIS — R0602 Shortness of breath: Secondary | ICD-10-CM | POA: Diagnosis not present

## 2018-03-14 DIAGNOSIS — E782 Mixed hyperlipidemia: Secondary | ICD-10-CM | POA: Diagnosis not present

## 2018-03-14 DIAGNOSIS — R0902 Hypoxemia: Secondary | ICD-10-CM | POA: Diagnosis not present

## 2018-03-14 DIAGNOSIS — S82202B Unspecified fracture of shaft of left tibia, initial encounter for open fracture type I or II: Secondary | ICD-10-CM | POA: Diagnosis not present

## 2018-03-14 DIAGNOSIS — G43909 Migraine, unspecified, not intractable, without status migrainosus: Secondary | ICD-10-CM | POA: Diagnosis not present

## 2018-03-14 DIAGNOSIS — R05 Cough: Secondary | ICD-10-CM | POA: Diagnosis not present

## 2018-03-14 DIAGNOSIS — N184 Chronic kidney disease, stage 4 (severe): Secondary | ICD-10-CM | POA: Diagnosis not present

## 2018-03-14 DIAGNOSIS — E1122 Type 2 diabetes mellitus with diabetic chronic kidney disease: Secondary | ICD-10-CM | POA: Diagnosis not present

## 2018-03-14 DIAGNOSIS — I714 Abdominal aortic aneurysm, without rupture: Secondary | ICD-10-CM | POA: Diagnosis not present

## 2018-03-14 DIAGNOSIS — I10 Essential (primary) hypertension: Secondary | ICD-10-CM | POA: Diagnosis not present

## 2018-03-14 DIAGNOSIS — S82402B Unspecified fracture of shaft of left fibula, initial encounter for open fracture type I or II: Secondary | ICD-10-CM | POA: Diagnosis not present

## 2018-03-14 DIAGNOSIS — E1142 Type 2 diabetes mellitus with diabetic polyneuropathy: Secondary | ICD-10-CM | POA: Diagnosis not present

## 2018-03-14 MED ORDER — BEVACIZUMAB CHEMO INJECTION 1.25MG/0.05ML SYRINGE FOR KALEIDOSCOPE
1.2500 mg | INTRAVITREAL | Status: AC
  Administered 2018-03-14: 1.25 mg via INTRAVITREAL

## 2018-03-21 DIAGNOSIS — H25812 Combined forms of age-related cataract, left eye: Secondary | ICD-10-CM | POA: Diagnosis not present

## 2018-03-21 DIAGNOSIS — H2512 Age-related nuclear cataract, left eye: Secondary | ICD-10-CM | POA: Diagnosis not present

## 2018-04-07 DIAGNOSIS — E782 Mixed hyperlipidemia: Secondary | ICD-10-CM | POA: Diagnosis not present

## 2018-04-07 DIAGNOSIS — F331 Major depressive disorder, recurrent, moderate: Secondary | ICD-10-CM | POA: Diagnosis not present

## 2018-04-07 DIAGNOSIS — E1122 Type 2 diabetes mellitus with diabetic chronic kidney disease: Secondary | ICD-10-CM | POA: Diagnosis not present
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
## 2018-04-10 ENCOUNTER — Encounter (INDEPENDENT_AMBULATORY_CARE_PROVIDER_SITE_OTHER): Payer: Self-pay | Admitting: Ophthalmology

## 2018-04-10 ENCOUNTER — Ambulatory Visit (INDEPENDENT_AMBULATORY_CARE_PROVIDER_SITE_OTHER): Payer: Medicare HMO | Admitting: Ophthalmology

## 2018-04-10 DIAGNOSIS — H353231 Exudative age-related macular degeneration, bilateral, with active choroidal neovascularization: Secondary | ICD-10-CM | POA: Diagnosis not present

## 2018-04-10 DIAGNOSIS — H3581 Retinal edema: Secondary | ICD-10-CM

## 2018-04-10 DIAGNOSIS — H35033 Hypertensive retinopathy, bilateral: Secondary | ICD-10-CM

## 2018-04-10 DIAGNOSIS — H25813 Combined forms of age-related cataract, bilateral: Secondary | ICD-10-CM

## 2018-04-10 DIAGNOSIS — I10 Essential (primary) hypertension: Secondary | ICD-10-CM

## 2018-04-10 DIAGNOSIS — E113393 Type 2 diabetes mellitus with moderate nonproliferative diabetic retinopathy without macular edema, bilateral: Secondary | ICD-10-CM

## 2018-04-12 ENCOUNTER — Encounter (INDEPENDENT_AMBULATORY_CARE_PROVIDER_SITE_OTHER): Payer: Self-pay | Admitting: Ophthalmology

## 2018-04-12 DIAGNOSIS — R0902 Hypoxemia: Secondary | ICD-10-CM | POA: Diagnosis not present

## 2018-04-12 DIAGNOSIS — R05 Cough: Secondary | ICD-10-CM | POA: Diagnosis not present

## 2018-04-12 DIAGNOSIS — R062 Wheezing: Secondary | ICD-10-CM | POA: Diagnosis not present

## 2018-04-12 DIAGNOSIS — R092 Respiratory arrest: Secondary | ICD-10-CM | POA: Diagnosis not present

## 2018-04-12 DIAGNOSIS — J449 Chronic obstructive pulmonary disease, unspecified: Secondary | ICD-10-CM | POA: Diagnosis not present

## 2018-04-12 MED ORDER — BEVACIZUMAB CHEMO INJECTION 1.25MG/0.05ML SYRINGE FOR KALEIDOSCOPE
1.2500 mg | INTRAVITREAL | Status: AC
  Administered 2018-04-12: 1.25 mg via INTRAVITREAL

## 2018-04-17 ENCOUNTER — Telehealth: Payer: Self-pay | Admitting: Vascular Surgery

## 2018-04-17 NOTE — Telephone Encounter (Signed)
Spoke with pt. He refuses to come back for follow up at this time. Michela Pitcher he will r/s later.

## 2018-04-19 DIAGNOSIS — H2511 Age-related nuclear cataract, right eye: Secondary | ICD-10-CM | POA: Diagnosis not present

## 2018-04-19 DIAGNOSIS — H25011 Cortical age-related cataract, right eye: Secondary | ICD-10-CM | POA: Diagnosis not present

## 2018-05-10 HISTORY — PX: CATARACT EXTRACTION: SUR2
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
## 2018-05-13 DIAGNOSIS — R05 Cough: Secondary | ICD-10-CM | POA: Diagnosis not present

## 2018-05-13 DIAGNOSIS — R0902 Hypoxemia: Secondary | ICD-10-CM | POA: Diagnosis not present

## 2018-05-13 DIAGNOSIS — J449 Chronic obstructive pulmonary disease, unspecified: Secondary | ICD-10-CM | POA: Diagnosis not present

## 2018-05-13 DIAGNOSIS — R062 Wheezing: Secondary | ICD-10-CM | POA: Diagnosis not present

## 2018-05-16 ENCOUNTER — Encounter (INDEPENDENT_AMBULATORY_CARE_PROVIDER_SITE_OTHER): Payer: Self-pay | Admitting: Ophthalmology

## 2018-05-16 ENCOUNTER — Other Ambulatory Visit: Payer: Self-pay

## 2018-05-16 ENCOUNTER — Ambulatory Visit (INDEPENDENT_AMBULATORY_CARE_PROVIDER_SITE_OTHER): Payer: Medicare HMO | Admitting: Ophthalmology

## 2018-05-16 DIAGNOSIS — H353231 Exudative age-related macular degeneration, bilateral, with active choroidal neovascularization: Secondary | ICD-10-CM | POA: Diagnosis not present

## 2018-05-16 DIAGNOSIS — H25813 Combined forms of age-related cataract, bilateral: Secondary | ICD-10-CM

## 2018-05-16 DIAGNOSIS — E113393 Type 2 diabetes mellitus with moderate nonproliferative diabetic retinopathy without macular edema, bilateral: Secondary | ICD-10-CM | POA: Diagnosis not present

## 2018-05-16 DIAGNOSIS — H3581 Retinal edema: Secondary | ICD-10-CM | POA: Diagnosis not present

## 2018-05-16 DIAGNOSIS — I10 Essential (primary) hypertension: Secondary | ICD-10-CM | POA: Diagnosis not present

## 2018-05-16 DIAGNOSIS — H35033 Hypertensive retinopathy, bilateral: Secondary | ICD-10-CM | POA: Diagnosis not present

## 2018-05-16 MED ORDER — BEVACIZUMAB CHEMO INJECTION 1.25MG/0.05ML SYRINGE FOR KALEIDOSCOPE
1.2500 mg | INTRAVITREAL | Status: AC | PRN
  Administered 2018-05-16: 1.25 mg via INTRAVITREAL

## 2018-05-16 MED ORDER — AFLIBERCEPT 2MG/0.05ML IZ SOLN FOR KALEIDOSCOPE
2.0000 mg | INTRAVITREAL | Status: AC | PRN
  Administered 2018-05-16: 2 mg via INTRAVITREAL

## 2018-06-05 DIAGNOSIS — E039 Hypothyroidism, unspecified: Secondary | ICD-10-CM | POA: Diagnosis not present

## 2018-06-05 DIAGNOSIS — E782 Mixed hyperlipidemia: Secondary | ICD-10-CM | POA: Diagnosis not present

## 2018-06-05 DIAGNOSIS — R05 Cough: Secondary | ICD-10-CM | POA: Diagnosis not present

## 2018-06-05 DIAGNOSIS — H538 Other visual disturbances: Secondary | ICD-10-CM | POA: Diagnosis not present

## 2018-06-05 DIAGNOSIS — E1122 Type 2 diabetes mellitus with diabetic chronic kidney disease: Secondary | ICD-10-CM | POA: Diagnosis not present

## 2018-06-06 DIAGNOSIS — I25119 Atherosclerotic heart disease of native coronary artery with unspecified angina pectoris: Secondary | ICD-10-CM | POA: Diagnosis not present

## 2018-06-06 DIAGNOSIS — E039 Hypothyroidism, unspecified: Secondary | ICD-10-CM | POA: Diagnosis not present

## 2018-06-06 DIAGNOSIS — J440 Chronic obstructive pulmonary disease with acute lower respiratory infection: Secondary | ICD-10-CM | POA: Diagnosis not present

## 2018-06-06 DIAGNOSIS — E1122 Type 2 diabetes mellitus with diabetic chronic kidney disease: Secondary | ICD-10-CM | POA: Diagnosis not present

## 2018-06-06 DIAGNOSIS — F17210 Nicotine dependence, cigarettes, uncomplicated: Secondary | ICD-10-CM | POA: Diagnosis not present

## 2018-06-06 DIAGNOSIS — F339 Major depressive disorder, recurrent, unspecified: Secondary | ICD-10-CM | POA: Diagnosis not present

## 2018-06-06 DIAGNOSIS — R079 Chest pain, unspecified: Secondary | ICD-10-CM | POA: Diagnosis not present

## 2018-06-06 DIAGNOSIS — N189 Chronic kidney disease, unspecified: Secondary | ICD-10-CM | POA: Diagnosis not present

## 2018-06-06 DIAGNOSIS — I209 Angina pectoris, unspecified: Secondary | ICD-10-CM | POA: Diagnosis not present

## 2018-06-06 DIAGNOSIS — J9611 Chronic respiratory failure with hypoxia: Secondary | ICD-10-CM | POA: Diagnosis not present

## 2018-06-06 DIAGNOSIS — J9811 Atelectasis: Secondary | ICD-10-CM | POA: Diagnosis not present

## 2018-06-06 DIAGNOSIS — J449 Chronic obstructive pulmonary disease, unspecified: Secondary | ICD-10-CM | POA: Diagnosis not present

## 2018-06-06 DIAGNOSIS — I428 Other cardiomyopathies: Secondary | ICD-10-CM | POA: Diagnosis not present

## 2018-06-06 DIAGNOSIS — I10 Essential (primary) hypertension: Secondary | ICD-10-CM | POA: Diagnosis not present

## 2018-06-06 DIAGNOSIS — I5032 Chronic diastolic (congestive) heart failure: Secondary | ICD-10-CM | POA: Diagnosis not present

## 2018-06-06 DIAGNOSIS — E782 Mixed hyperlipidemia: Secondary | ICD-10-CM | POA: Diagnosis not present

## 2018-06-06 DIAGNOSIS — E119 Type 2 diabetes mellitus without complications: Secondary | ICD-10-CM | POA: Diagnosis not present

## 2018-06-06 DIAGNOSIS — Z9981 Dependence on supplemental oxygen: Secondary | ICD-10-CM | POA: Diagnosis not present

## 2018-06-06 DIAGNOSIS — I130 Hypertensive heart and chronic kidney disease with heart failure and stage 1 through stage 4 chronic kidney disease, or unspecified chronic kidney disease: Secondary | ICD-10-CM | POA: Diagnosis not present

## 2018-06-06 DIAGNOSIS — Z8673 Personal history of transient ischemic attack (TIA), and cerebral infarction without residual deficits: Secondary | ICD-10-CM | POA: Diagnosis not present

## 2018-06-06 DIAGNOSIS — I714 Abdominal aortic aneurysm, without rupture: Secondary | ICD-10-CM | POA: Diagnosis not present

## 2018-06-06 DIAGNOSIS — R0789 Other chest pain: Secondary | ICD-10-CM | POA: Diagnosis not present

## 2018-06-08 DIAGNOSIS — E039 Hypothyroidism, unspecified: Secondary | ICD-10-CM | POA: Diagnosis not present

## 2018-06-08 DIAGNOSIS — E1122 Type 2 diabetes mellitus with diabetic chronic kidney disease: Secondary | ICD-10-CM | POA: Diagnosis not present

## 2018-06-08 DIAGNOSIS — E782 Mixed hyperlipidemia: Secondary | ICD-10-CM | POA: Diagnosis not present

## 2018-06-14 DIAGNOSIS — J9611 Chronic respiratory failure with hypoxia: Secondary | ICD-10-CM | POA: Diagnosis not present

## 2018-06-14 DIAGNOSIS — J440 Chronic obstructive pulmonary disease with acute lower respiratory infection: Secondary | ICD-10-CM | POA: Diagnosis not present
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
## 2018-06-20 ENCOUNTER — Ambulatory Visit (INDEPENDENT_AMBULATORY_CARE_PROVIDER_SITE_OTHER): Payer: Medicare HMO | Admitting: Ophthalmology

## 2018-06-20 ENCOUNTER — Encounter (INDEPENDENT_AMBULATORY_CARE_PROVIDER_SITE_OTHER): Payer: Self-pay | Admitting: Ophthalmology

## 2018-06-20 ENCOUNTER — Other Ambulatory Visit: Payer: Self-pay

## 2018-06-20 DIAGNOSIS — H25811 Combined forms of age-related cataract, right eye: Secondary | ICD-10-CM | POA: Diagnosis not present

## 2018-06-20 DIAGNOSIS — H3581 Retinal edema: Secondary | ICD-10-CM | POA: Diagnosis not present

## 2018-06-20 DIAGNOSIS — H353231 Exudative age-related macular degeneration, bilateral, with active choroidal neovascularization: Secondary | ICD-10-CM

## 2018-06-20 DIAGNOSIS — H35033 Hypertensive retinopathy, bilateral: Secondary | ICD-10-CM

## 2018-06-20 DIAGNOSIS — Z961 Presence of intraocular lens: Secondary | ICD-10-CM

## 2018-06-20 DIAGNOSIS — E113393 Type 2 diabetes mellitus with moderate nonproliferative diabetic retinopathy without macular edema, bilateral: Secondary | ICD-10-CM | POA: Diagnosis not present

## 2018-06-20 DIAGNOSIS — I10 Essential (primary) hypertension: Secondary | ICD-10-CM

## 2018-06-20 MED ORDER — AFLIBERCEPT 2MG/0.05ML IZ SOLN FOR KALEIDOSCOPE
2.0000 mg | INTRAVITREAL | Status: AC | PRN
  Administered 2018-06-20: 2 mg via INTRAVITREAL

## 2018-06-20 MED ORDER — BEVACIZUMAB CHEMO INJECTION 1.25MG/0.05ML SYRINGE FOR KALEIDOSCOPE
1.2500 mg | INTRAVITREAL | Status: AC | PRN
  Administered 2018-06-20: 1.25 mg via INTRAVITREAL

## 2018-06-27 DIAGNOSIS — H25011 Cortical age-related cataract, right eye: Secondary | ICD-10-CM | POA: Diagnosis not present

## 2018-06-27 DIAGNOSIS — H2511 Age-related nuclear cataract, right eye: Secondary | ICD-10-CM | POA: Diagnosis not present

## 2018-07-09 DIAGNOSIS — J9611 Chronic respiratory failure with hypoxia: Secondary | ICD-10-CM | POA: Diagnosis not present

## 2018-07-09 DIAGNOSIS — J440 Chronic obstructive pulmonary disease with acute lower respiratory infection: Secondary | ICD-10-CM | POA: Diagnosis not present

## 2018-07-10 HISTORY — PX: CATARACT EXTRACTION: SUR2
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
## 2018-08-01 ENCOUNTER — Ambulatory Visit (INDEPENDENT_AMBULATORY_CARE_PROVIDER_SITE_OTHER): Payer: Medicare HMO | Admitting: Ophthalmology

## 2018-08-01 ENCOUNTER — Encounter (INDEPENDENT_AMBULATORY_CARE_PROVIDER_SITE_OTHER): Payer: Self-pay | Admitting: Ophthalmology

## 2018-08-01 ENCOUNTER — Other Ambulatory Visit: Payer: Self-pay

## 2018-08-01 DIAGNOSIS — H35033 Hypertensive retinopathy, bilateral: Secondary | ICD-10-CM | POA: Diagnosis not present

## 2018-08-01 DIAGNOSIS — I10 Essential (primary) hypertension: Secondary | ICD-10-CM

## 2018-08-01 DIAGNOSIS — Z961 Presence of intraocular lens: Secondary | ICD-10-CM | POA: Diagnosis not present

## 2018-08-01 DIAGNOSIS — E113393 Type 2 diabetes mellitus with moderate nonproliferative diabetic retinopathy without macular edema, bilateral: Secondary | ICD-10-CM

## 2018-08-01 DIAGNOSIS — H3581 Retinal edema: Secondary | ICD-10-CM | POA: Diagnosis not present

## 2018-08-01 DIAGNOSIS — H353231 Exudative age-related macular degeneration, bilateral, with active choroidal neovascularization: Secondary | ICD-10-CM | POA: Diagnosis not present

## 2018-08-01 MED ORDER — AFLIBERCEPT 2MG/0.05ML IZ SOLN FOR KALEIDOSCOPE
2.0000 mg | INTRAVITREAL | Status: AC | PRN
  Administered 2018-08-01: 2 mg via INTRAVITREAL

## 2018-08-01 MED ORDER — BEVACIZUMAB CHEMO INJECTION 1.25MG/0.05ML SYRINGE FOR KALEIDOSCOPE
1.2500 mg | INTRAVITREAL | Status: AC | PRN
  Administered 2018-08-01: 1.25 mg via INTRAVITREAL

## 2018-08-13 IMAGING — DX DG CHEST 1V PORT
1 series · 2 of 2 positions shown · non-contrast
Comparison: 04/10/2017, 03/21/2017, 08/01/2016

CLINICAL DATA: Shortness of breath

EXAM:
PORTABLE CHEST 1 VIEW

[Series 1: chest · 0.14mm/px · 2 of 2 slices shown]
[im 1/2]
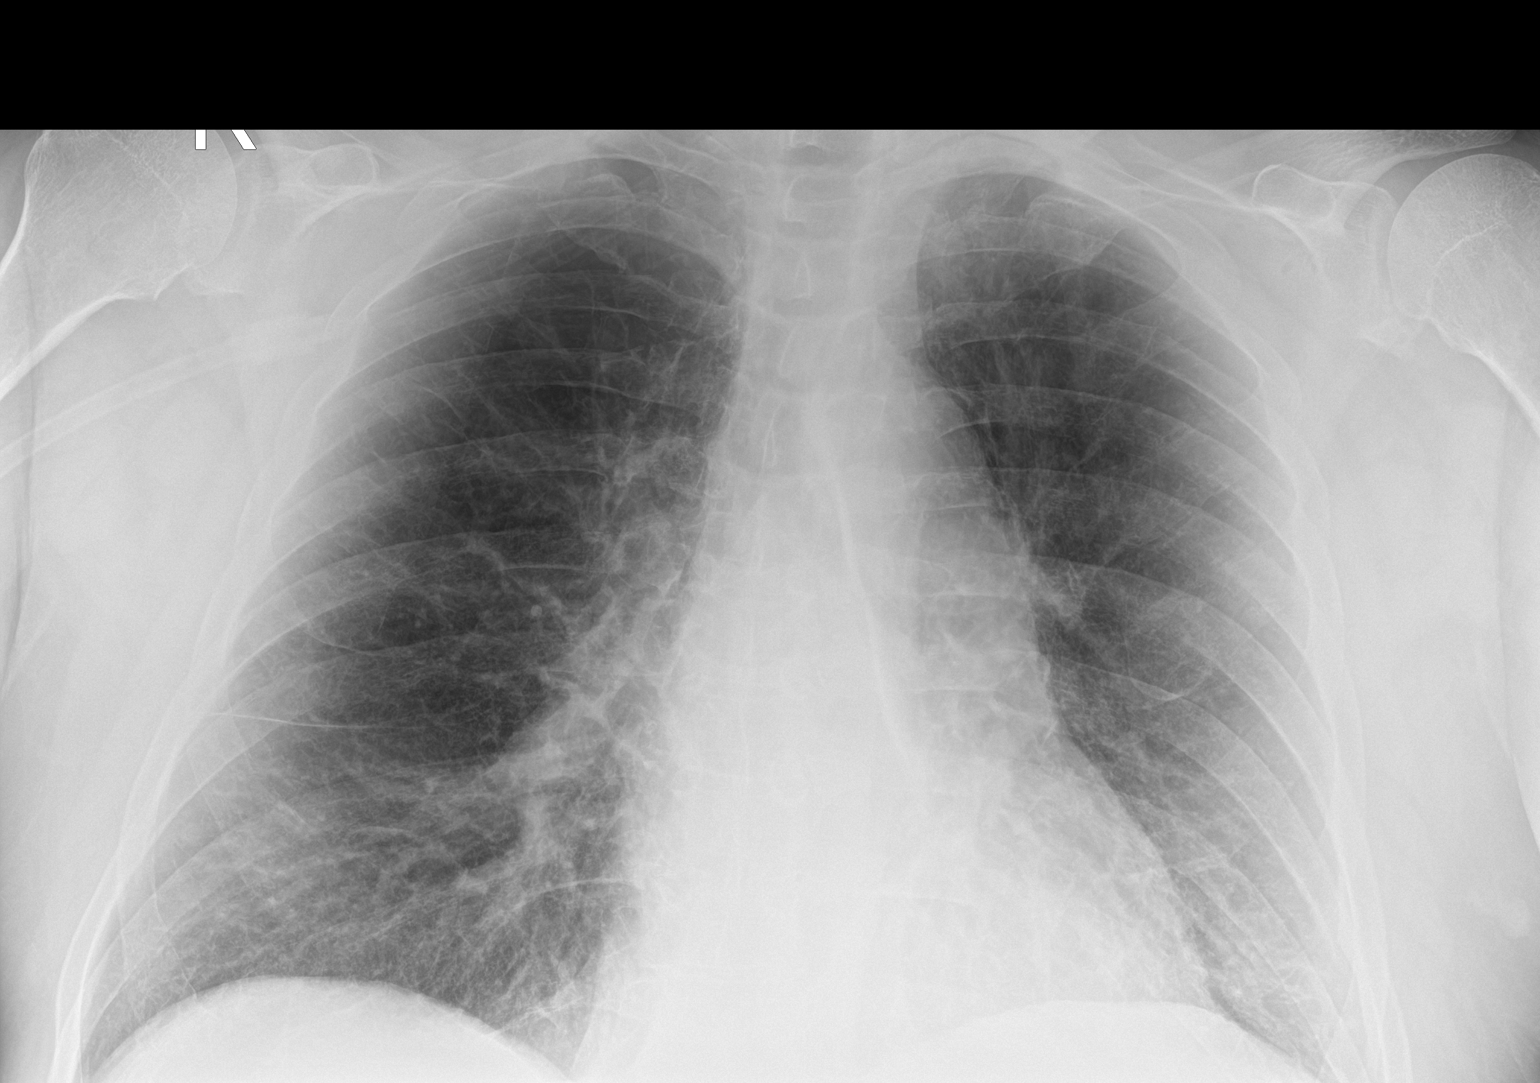
[im 2/2]
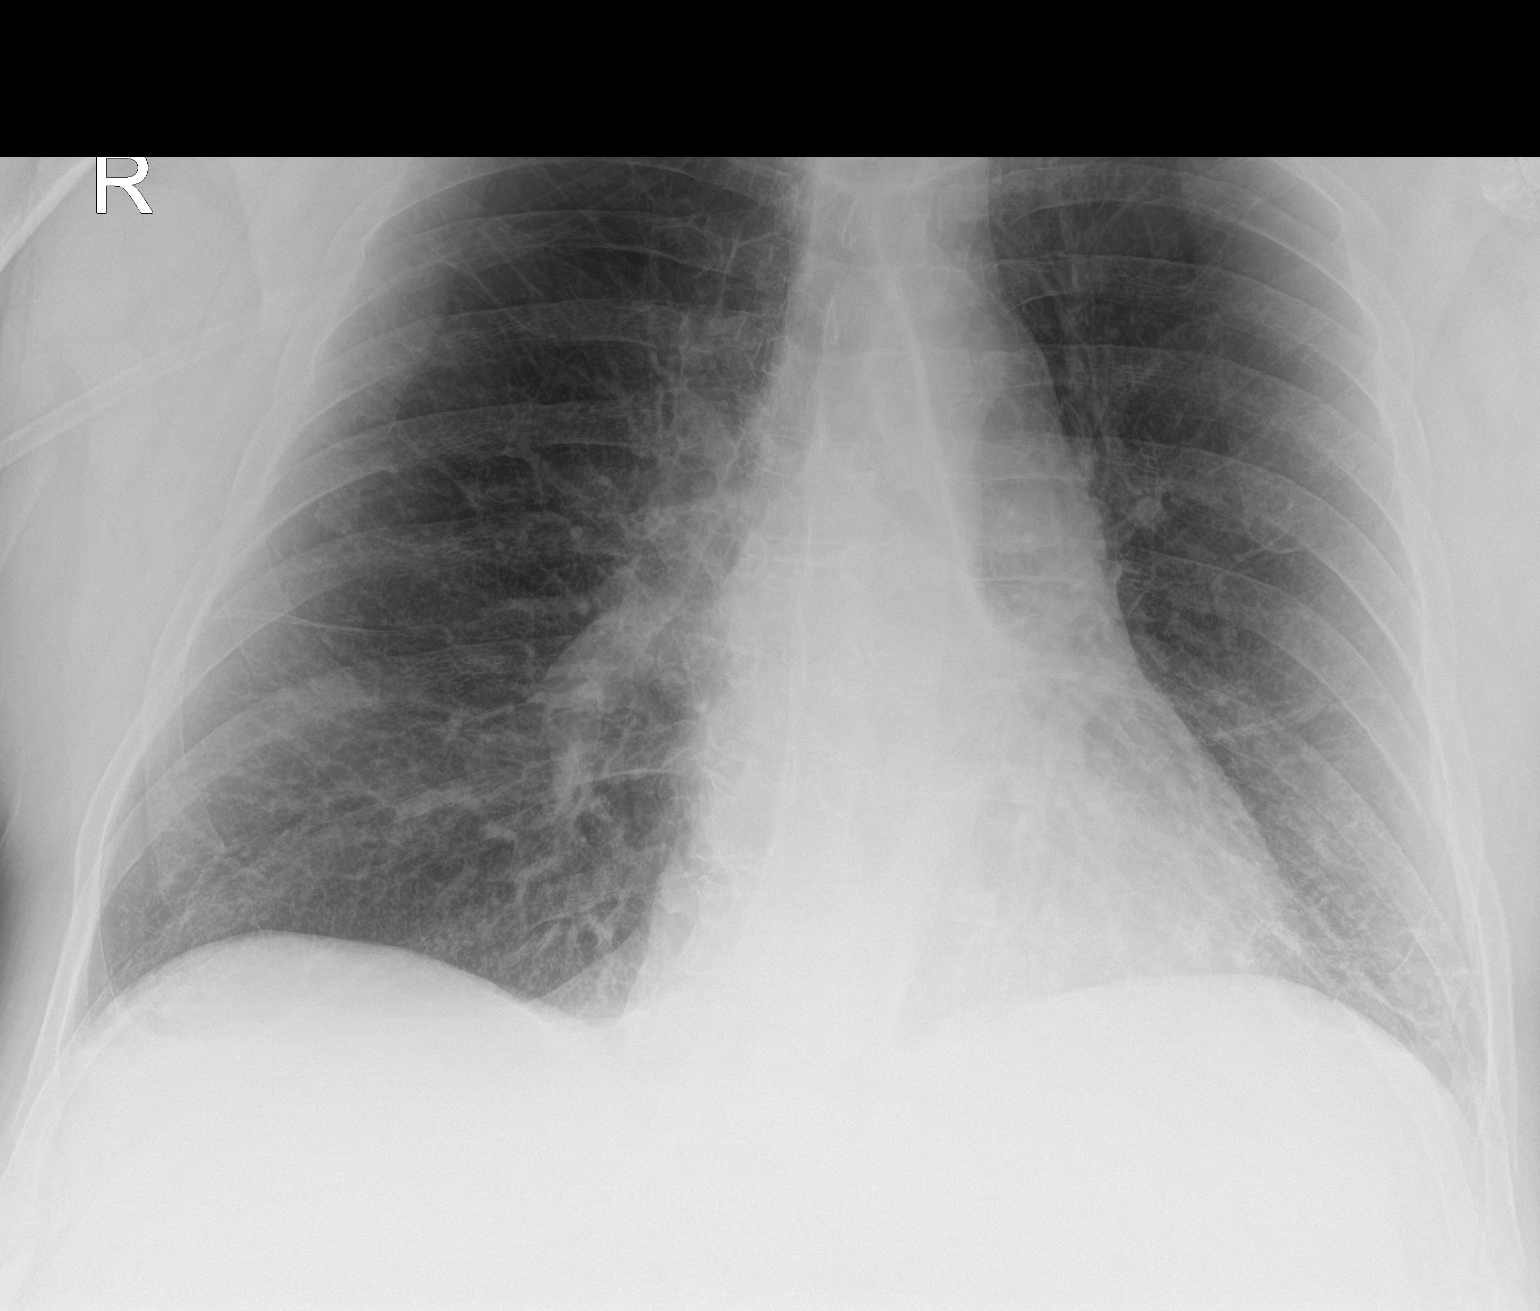

[2 of 2 positions shown; findings below may reference images not displayed]

FINDINGS: Coarse interstitial opacities at the lung bases, suspect chronic
change. No acute consolidation or effusion. Stable cardiomediastinal
silhouette with aortic atherosclerosis. No pneumothorax.
Emphysematous disease.
IMPRESSION: Emphysematous disease with chronic coarse interstitial bibasilar
opacity. No acute focal pulmonary opacity is seen.
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
## 2018-08-29 ENCOUNTER — Encounter (INDEPENDENT_AMBULATORY_CARE_PROVIDER_SITE_OTHER): Payer: Self-pay | Admitting: Ophthalmology

## 2018-08-29 ENCOUNTER — Ambulatory Visit (INDEPENDENT_AMBULATORY_CARE_PROVIDER_SITE_OTHER): Payer: Medicare HMO | Admitting: Ophthalmology

## 2018-08-29 ENCOUNTER — Other Ambulatory Visit: Payer: Self-pay

## 2018-08-29 DIAGNOSIS — E113393 Type 2 diabetes mellitus with moderate nonproliferative diabetic retinopathy without macular edema, bilateral: Secondary | ICD-10-CM

## 2018-08-29 DIAGNOSIS — I10 Essential (primary) hypertension: Secondary | ICD-10-CM | POA: Diagnosis not present

## 2018-08-29 DIAGNOSIS — H3581 Retinal edema: Secondary | ICD-10-CM

## 2018-08-29 DIAGNOSIS — H35033 Hypertensive retinopathy, bilateral: Secondary | ICD-10-CM

## 2018-08-29 DIAGNOSIS — Z961 Presence of intraocular lens: Secondary | ICD-10-CM

## 2018-08-29 DIAGNOSIS — H353231 Exudative age-related macular degeneration, bilateral, with active choroidal neovascularization: Secondary | ICD-10-CM | POA: Diagnosis not present

## 2018-08-29 MED ORDER — AFLIBERCEPT 2MG/0.05ML IZ SOLN FOR KALEIDOSCOPE
2.0000 mg | INTRAVITREAL | Status: AC | PRN
  Administered 2018-08-29: 2 mg via INTRAVITREAL

## 2018-09-08 DIAGNOSIS — F331 Major depressive disorder, recurrent, moderate: Secondary | ICD-10-CM | POA: Diagnosis not present

## 2018-09-08 DIAGNOSIS — E782 Mixed hyperlipidemia: Secondary | ICD-10-CM | POA: Diagnosis not present

## 2018-09-26 ENCOUNTER — Encounter (INDEPENDENT_AMBULATORY_CARE_PROVIDER_SITE_OTHER): Payer: Medicare HMO | Admitting: Ophthalmology

## 2018-10-03 ENCOUNTER — Ambulatory Visit: Payer: Medicare HMO | Admitting: Vascular Surgery
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
## 2018-10-31 ENCOUNTER — Encounter (INDEPENDENT_AMBULATORY_CARE_PROVIDER_SITE_OTHER): Payer: Self-pay | Admitting: Ophthalmology

## 2018-10-31 ENCOUNTER — Ambulatory Visit (INDEPENDENT_AMBULATORY_CARE_PROVIDER_SITE_OTHER): Payer: Medicare HMO | Admitting: Ophthalmology

## 2018-10-31 ENCOUNTER — Other Ambulatory Visit: Payer: Self-pay

## 2018-10-31 DIAGNOSIS — H353231 Exudative age-related macular degeneration, bilateral, with active choroidal neovascularization: Secondary | ICD-10-CM | POA: Diagnosis not present

## 2018-10-31 DIAGNOSIS — Z961 Presence of intraocular lens: Secondary | ICD-10-CM | POA: Diagnosis not present

## 2018-10-31 DIAGNOSIS — H35033 Hypertensive retinopathy, bilateral: Secondary | ICD-10-CM

## 2018-10-31 DIAGNOSIS — I10 Essential (primary) hypertension: Secondary | ICD-10-CM

## 2018-10-31 DIAGNOSIS — E113393 Type 2 diabetes mellitus with moderate nonproliferative diabetic retinopathy without macular edema, bilateral: Secondary | ICD-10-CM | POA: Diagnosis not present

## 2018-10-31 DIAGNOSIS — H3581 Retinal edema: Secondary | ICD-10-CM | POA: Diagnosis not present

## 2018-10-31 MED ORDER — AFLIBERCEPT 2MG/0.05ML IZ SOLN FOR KALEIDOSCOPE
2.0000 mg | INTRAVITREAL | Status: AC | PRN
  Administered 2018-10-31: 2 mg via INTRAVITREAL
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
## 2018-12-04 DIAGNOSIS — H353231 Exudative age-related macular degeneration, bilateral, with active choroidal neovascularization: Secondary | ICD-10-CM | POA: Diagnosis not present

## 2018-12-05 ENCOUNTER — Other Ambulatory Visit: Payer: Self-pay

## 2018-12-05 ENCOUNTER — Ambulatory Visit (INDEPENDENT_AMBULATORY_CARE_PROVIDER_SITE_OTHER): Payer: Medicare HMO | Admitting: Ophthalmology

## 2018-12-05 ENCOUNTER — Encounter (INDEPENDENT_AMBULATORY_CARE_PROVIDER_SITE_OTHER): Payer: Self-pay | Admitting: Ophthalmology

## 2018-12-05 DIAGNOSIS — I10 Essential (primary) hypertension: Secondary | ICD-10-CM

## 2018-12-05 DIAGNOSIS — Z961 Presence of intraocular lens: Secondary | ICD-10-CM | POA: Diagnosis not present

## 2018-12-05 DIAGNOSIS — H353231 Exudative age-related macular degeneration, bilateral, with active choroidal neovascularization: Secondary | ICD-10-CM | POA: Diagnosis not present

## 2018-12-05 DIAGNOSIS — H3581 Retinal edema: Secondary | ICD-10-CM | POA: Diagnosis not present

## 2018-12-05 DIAGNOSIS — H25813 Combined forms of age-related cataract, bilateral: Secondary | ICD-10-CM | POA: Diagnosis not present

## 2018-12-05 DIAGNOSIS — E113393 Type 2 diabetes mellitus with moderate nonproliferative diabetic retinopathy without macular edema, bilateral: Secondary | ICD-10-CM

## 2018-12-05 DIAGNOSIS — H25811 Combined forms of age-related cataract, right eye: Secondary | ICD-10-CM

## 2018-12-05 DIAGNOSIS — H35033 Hypertensive retinopathy, bilateral: Secondary | ICD-10-CM

## 2018-12-05 MED ORDER — AFLIBERCEPT 2MG/0.05ML IZ SOLN FOR KALEIDOSCOPE
2.0000 mg | INTRAVITREAL | Status: AC | PRN
  Administered 2018-12-05: 2 mg via INTRAVITREAL

## 2018-12-08 DIAGNOSIS — E782 Mixed hyperlipidemia: Secondary | ICD-10-CM | POA: Diagnosis not present

## 2018-12-08 DIAGNOSIS — I10 Essential (primary) hypertension: Secondary | ICD-10-CM | POA: Diagnosis not present

## 2018-12-18 DIAGNOSIS — Z23 Encounter for immunization: Secondary | ICD-10-CM | POA: Diagnosis not present
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
## 2019-01-01 DIAGNOSIS — I714 Abdominal aortic aneurysm, without rupture: Secondary | ICD-10-CM | POA: Diagnosis not present

## 2019-01-01 DIAGNOSIS — J9611 Chronic respiratory failure with hypoxia: Secondary | ICD-10-CM | POA: Diagnosis not present

## 2019-01-01 DIAGNOSIS — G4733 Obstructive sleep apnea (adult) (pediatric): Secondary | ICD-10-CM | POA: Diagnosis not present

## 2019-01-01 DIAGNOSIS — J449 Chronic obstructive pulmonary disease, unspecified: Secondary | ICD-10-CM | POA: Diagnosis not present

## 2019-01-01 DIAGNOSIS — F331 Major depressive disorder, recurrent, moderate: Secondary | ICD-10-CM | POA: Diagnosis not present

## 2019-01-01 DIAGNOSIS — Z0001 Encounter for general adult medical examination with abnormal findings: Secondary | ICD-10-CM | POA: Diagnosis not present

## 2019-01-01 DIAGNOSIS — E6609 Other obesity due to excess calories: Secondary | ICD-10-CM | POA: Diagnosis not present

## 2019-01-01 DIAGNOSIS — G252 Other specified forms of tremor: Secondary | ICD-10-CM | POA: Diagnosis not present

## 2019-01-01 DIAGNOSIS — F17210 Nicotine dependence, cigarettes, uncomplicated: Secondary | ICD-10-CM | POA: Diagnosis not present

## 2019-01-01 DIAGNOSIS — I10 Essential (primary) hypertension: Secondary | ICD-10-CM | POA: Diagnosis not present

## 2019-01-01 DIAGNOSIS — G8194 Hemiplegia, unspecified affecting left nondominant side: Secondary | ICD-10-CM | POA: Diagnosis not present

## 2019-01-01 DIAGNOSIS — E1122 Type 2 diabetes mellitus with diabetic chronic kidney disease: Secondary | ICD-10-CM | POA: Diagnosis not present

## 2019-01-01 DIAGNOSIS — Z6827 Body mass index (BMI) 27.0-27.9, adult: Secondary | ICD-10-CM | POA: Diagnosis not present

## 2019-01-01 DIAGNOSIS — E1142 Type 2 diabetes mellitus with diabetic polyneuropathy: Secondary | ICD-10-CM | POA: Diagnosis not present

## 2019-01-01 DIAGNOSIS — I739 Peripheral vascular disease, unspecified: Secondary | ICD-10-CM | POA: Diagnosis not present

## 2019-01-01 DIAGNOSIS — E782 Mixed hyperlipidemia: Secondary | ICD-10-CM | POA: Diagnosis not present

## 2019-01-02 ENCOUNTER — Other Ambulatory Visit: Payer: Self-pay

## 2019-01-02 ENCOUNTER — Encounter (INDEPENDENT_AMBULATORY_CARE_PROVIDER_SITE_OTHER): Payer: Self-pay | Admitting: Ophthalmology

## 2019-01-02 ENCOUNTER — Ambulatory Visit (INDEPENDENT_AMBULATORY_CARE_PROVIDER_SITE_OTHER): Payer: Medicare HMO | Admitting: Ophthalmology

## 2019-01-02 DIAGNOSIS — I10 Essential (primary) hypertension: Secondary | ICD-10-CM

## 2019-01-02 DIAGNOSIS — H35033 Hypertensive retinopathy, bilateral: Secondary | ICD-10-CM | POA: Diagnosis not present

## 2019-01-02 DIAGNOSIS — H3581 Retinal edema: Secondary | ICD-10-CM

## 2019-01-02 DIAGNOSIS — E113393 Type 2 diabetes mellitus with moderate nonproliferative diabetic retinopathy without macular edema, bilateral: Secondary | ICD-10-CM

## 2019-01-02 DIAGNOSIS — Z961 Presence of intraocular lens: Secondary | ICD-10-CM | POA: Diagnosis not present

## 2019-01-02 DIAGNOSIS — H353231 Exudative age-related macular degeneration, bilateral, with active choroidal neovascularization: Secondary | ICD-10-CM

## 2019-01-02 MED ORDER — AFLIBERCEPT 2MG/0.05ML IZ SOLN FOR KALEIDOSCOPE
2.0000 mg | INTRAVITREAL | Status: AC | PRN
  Administered 2019-01-02: 2 mg via INTRAVITREAL

## 2019-01-03 ENCOUNTER — Encounter (INDEPENDENT_AMBULATORY_CARE_PROVIDER_SITE_OTHER): Payer: Medicare HMO | Admitting: Ophthalmology

## 2019-01-11 DIAGNOSIS — I714 Abdominal aortic aneurysm, without rupture: Secondary | ICD-10-CM | POA: Diagnosis not present
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
## 2019-01-30 ENCOUNTER — Encounter (INDEPENDENT_AMBULATORY_CARE_PROVIDER_SITE_OTHER): Payer: Self-pay | Admitting: Ophthalmology

## 2019-01-30 ENCOUNTER — Ambulatory Visit (INDEPENDENT_AMBULATORY_CARE_PROVIDER_SITE_OTHER): Payer: Medicare HMO | Admitting: Ophthalmology

## 2019-01-30 DIAGNOSIS — E113393 Type 2 diabetes mellitus with moderate nonproliferative diabetic retinopathy without macular edema, bilateral: Secondary | ICD-10-CM

## 2019-01-30 DIAGNOSIS — H353231 Exudative age-related macular degeneration, bilateral, with active choroidal neovascularization: Secondary | ICD-10-CM | POA: Diagnosis not present

## 2019-01-30 DIAGNOSIS — H3581 Retinal edema: Secondary | ICD-10-CM | POA: Diagnosis not present

## 2019-01-30 DIAGNOSIS — H35033 Hypertensive retinopathy, bilateral: Secondary | ICD-10-CM

## 2019-01-30 DIAGNOSIS — I10 Essential (primary) hypertension: Secondary | ICD-10-CM

## 2019-01-30 DIAGNOSIS — Z961 Presence of intraocular lens: Secondary | ICD-10-CM | POA: Diagnosis not present

## 2019-01-30 MED ORDER — AFLIBERCEPT 2MG/0.05ML IZ SOLN FOR KALEIDOSCOPE
2.0000 mg | INTRAVITREAL | Status: AC | PRN
  Administered 2019-01-30: 2 mg via INTRAVITREAL

## 2019-02-15 DIAGNOSIS — Z6830 Body mass index (BMI) 30.0-30.9, adult: Secondary | ICD-10-CM | POA: Diagnosis not present

## 2019-02-15 DIAGNOSIS — F17210 Nicotine dependence, cigarettes, uncomplicated: Secondary | ICD-10-CM | POA: Diagnosis not present

## 2019-02-15 DIAGNOSIS — I739 Peripheral vascular disease, unspecified: Secondary | ICD-10-CM | POA: Diagnosis not present

## 2019-02-15 DIAGNOSIS — J449 Chronic obstructive pulmonary disease, unspecified: Secondary | ICD-10-CM | POA: Diagnosis not present

## 2019-02-15 DIAGNOSIS — E782 Mixed hyperlipidemia: Secondary | ICD-10-CM | POA: Diagnosis not present

## 2019-02-15 DIAGNOSIS — Z0001 Encounter for general adult medical examination with abnormal findings: Secondary | ICD-10-CM | POA: Diagnosis not present

## 2019-02-15 DIAGNOSIS — E1142 Type 2 diabetes mellitus with diabetic polyneuropathy: Secondary | ICD-10-CM | POA: Diagnosis not present

## 2019-02-15 DIAGNOSIS — J9611 Chronic respiratory failure with hypoxia: Secondary | ICD-10-CM | POA: Diagnosis not present

## 2019-02-15 DIAGNOSIS — E6609 Other obesity due to excess calories: Secondary | ICD-10-CM | POA: Diagnosis not present

## 2019-02-15 DIAGNOSIS — G4733 Obstructive sleep apnea (adult) (pediatric): Secondary | ICD-10-CM | POA: Diagnosis not present

## 2019-02-15 DIAGNOSIS — F331 Major depressive disorder, recurrent, moderate: Secondary | ICD-10-CM | POA: Diagnosis not present

## 2019-02-15 DIAGNOSIS — G8194 Hemiplegia, unspecified affecting left nondominant side: Secondary | ICD-10-CM | POA: Diagnosis not present

## 2019-02-15 DIAGNOSIS — Z836 Family history of other diseases of the respiratory system: Secondary | ICD-10-CM | POA: Diagnosis not present

## 2019-02-15 DIAGNOSIS — E1122 Type 2 diabetes mellitus with diabetic chronic kidney disease: Secondary | ICD-10-CM | POA: Diagnosis not present

## 2019-02-27 ENCOUNTER — Encounter (INDEPENDENT_AMBULATORY_CARE_PROVIDER_SITE_OTHER): Payer: Medicare HMO | Admitting: Ophthalmology

## 2019-03-07 ENCOUNTER — Encounter (INDEPENDENT_AMBULATORY_CARE_PROVIDER_SITE_OTHER): Payer: Medicare HMO | Admitting: Ophthalmology

## 2019-03-20 DIAGNOSIS — E1142 Type 2 diabetes mellitus with diabetic polyneuropathy: Secondary | ICD-10-CM | POA: Diagnosis not present

## 2019-03-20 DIAGNOSIS — K219 Gastro-esophageal reflux disease without esophagitis: Secondary | ICD-10-CM | POA: Diagnosis not present

## 2019-03-20 DIAGNOSIS — N184 Chronic kidney disease, stage 4 (severe): Secondary | ICD-10-CM | POA: Diagnosis not present

## 2019-03-20 DIAGNOSIS — J440 Chronic obstructive pulmonary disease with acute lower respiratory infection: Secondary | ICD-10-CM | POA: Diagnosis not present

## 2019-03-20 DIAGNOSIS — E1122 Type 2 diabetes mellitus with diabetic chronic kidney disease: Secondary | ICD-10-CM | POA: Diagnosis not present

## 2019-03-20 DIAGNOSIS — E782 Mixed hyperlipidemia: Secondary | ICD-10-CM | POA: Diagnosis not present

## 2019-03-20 DIAGNOSIS — E1165 Type 2 diabetes mellitus with hyperglycemia: Secondary | ICD-10-CM | POA: Diagnosis not present

## 2019-03-20 DIAGNOSIS — I10 Essential (primary) hypertension: Secondary | ICD-10-CM | POA: Diagnosis not present

## 2019-03-27 DIAGNOSIS — N184 Chronic kidney disease, stage 4 (severe): Secondary | ICD-10-CM | POA: Diagnosis not present

## 2019-03-27 DIAGNOSIS — G43909 Migraine, unspecified, not intractable, without status migrainosus: Secondary | ICD-10-CM | POA: Diagnosis not present

## 2019-03-27 DIAGNOSIS — R260 Ataxic gait: Secondary | ICD-10-CM | POA: Diagnosis not present

## 2019-03-27 DIAGNOSIS — G4733 Obstructive sleep apnea (adult) (pediatric): Secondary | ICD-10-CM | POA: Diagnosis not present

## 2019-03-27 DIAGNOSIS — E1122 Type 2 diabetes mellitus with diabetic chronic kidney disease: Secondary | ICD-10-CM | POA: Diagnosis not present

## 2019-03-27 DIAGNOSIS — J9611 Chronic respiratory failure with hypoxia: Secondary | ICD-10-CM | POA: Diagnosis not present

## 2019-03-27 DIAGNOSIS — I714 Abdominal aortic aneurysm, without rupture: Secondary | ICD-10-CM | POA: Diagnosis not present

## 2019-03-27 DIAGNOSIS — J449 Chronic obstructive pulmonary disease, unspecified: Secondary | ICD-10-CM | POA: Diagnosis not present

## 2019-03-27 DIAGNOSIS — Z836 Family history of other diseases of the respiratory system: Secondary | ICD-10-CM | POA: Diagnosis not present

## 2019-03-27 DIAGNOSIS — I10 Essential (primary) hypertension: Secondary | ICD-10-CM | POA: Diagnosis not present

## 2019-03-27 DIAGNOSIS — R4582 Worries: Secondary | ICD-10-CM | POA: Diagnosis not present

## 2019-03-27 DIAGNOSIS — I739 Peripheral vascular disease, unspecified: Secondary | ICD-10-CM | POA: Diagnosis not present

## 2019-03-27 DIAGNOSIS — F331 Major depressive disorder, recurrent, moderate: Secondary | ICD-10-CM | POA: Diagnosis not present

## 2019-03-27 DIAGNOSIS — F17210 Nicotine dependence, cigarettes, uncomplicated: Secondary | ICD-10-CM | POA: Diagnosis not present

## 2019-03-27 DIAGNOSIS — E1142 Type 2 diabetes mellitus with diabetic polyneuropathy: Secondary | ICD-10-CM | POA: Diagnosis not present

## 2019-03-27 DIAGNOSIS — G8194 Hemiplegia, unspecified affecting left nondominant side: Secondary | ICD-10-CM | POA: Diagnosis not present

## 2019-03-27 DIAGNOSIS — E6609 Other obesity due to excess calories: Secondary | ICD-10-CM | POA: Diagnosis not present

## 2019-03-27 DIAGNOSIS — E782 Mixed hyperlipidemia: Secondary | ICD-10-CM | POA: Diagnosis not present

## 2019-04-06 DIAGNOSIS — J449 Chronic obstructive pulmonary disease, unspecified: Secondary | ICD-10-CM | POA: Diagnosis not present

## 2019-04-06 DIAGNOSIS — F17210 Nicotine dependence, cigarettes, uncomplicated: Secondary | ICD-10-CM | POA: Diagnosis not present

## 2019-04-06 DIAGNOSIS — E7849 Other hyperlipidemia: Secondary | ICD-10-CM | POA: Diagnosis not present
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
## 2019-05-02 ENCOUNTER — Ambulatory Visit (INDEPENDENT_AMBULATORY_CARE_PROVIDER_SITE_OTHER): Payer: Medicare HMO | Admitting: Ophthalmology

## 2019-05-02 ENCOUNTER — Encounter (INDEPENDENT_AMBULATORY_CARE_PROVIDER_SITE_OTHER): Payer: Self-pay | Admitting: Ophthalmology

## 2019-05-02 DIAGNOSIS — H35033 Hypertensive retinopathy, bilateral: Secondary | ICD-10-CM | POA: Diagnosis not present

## 2019-05-02 DIAGNOSIS — Z961 Presence of intraocular lens: Secondary | ICD-10-CM | POA: Diagnosis not present

## 2019-05-02 DIAGNOSIS — I10 Essential (primary) hypertension: Secondary | ICD-10-CM | POA: Diagnosis not present

## 2019-05-02 DIAGNOSIS — H353231 Exudative age-related macular degeneration, bilateral, with active choroidal neovascularization: Secondary | ICD-10-CM | POA: Diagnosis not present

## 2019-05-02 DIAGNOSIS — E113393 Type 2 diabetes mellitus with moderate nonproliferative diabetic retinopathy without macular edema, bilateral: Secondary | ICD-10-CM

## 2019-05-02 DIAGNOSIS — H3581 Retinal edema: Secondary | ICD-10-CM

## 2019-05-02 MED ORDER — AFLIBERCEPT 2MG/0.05ML IZ SOLN FOR KALEIDOSCOPE
2.0000 mg | INTRAVITREAL | Status: AC | PRN
  Administered 2019-05-02: 2 mg via INTRAVITREAL

## 2019-05-03 DIAGNOSIS — J449 Chronic obstructive pulmonary disease, unspecified: Secondary | ICD-10-CM | POA: Diagnosis not present
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
## 2019-05-30 ENCOUNTER — Encounter (INDEPENDENT_AMBULATORY_CARE_PROVIDER_SITE_OTHER): Payer: Self-pay | Admitting: Ophthalmology

## 2019-05-30 ENCOUNTER — Ambulatory Visit (INDEPENDENT_AMBULATORY_CARE_PROVIDER_SITE_OTHER): Payer: Medicare HMO | Admitting: Ophthalmology

## 2019-05-30 ENCOUNTER — Other Ambulatory Visit: Payer: Self-pay

## 2019-05-30 DIAGNOSIS — H35033 Hypertensive retinopathy, bilateral: Secondary | ICD-10-CM

## 2019-05-30 DIAGNOSIS — H353231 Exudative age-related macular degeneration, bilateral, with active choroidal neovascularization: Secondary | ICD-10-CM | POA: Diagnosis not present

## 2019-05-30 DIAGNOSIS — Z961 Presence of intraocular lens: Secondary | ICD-10-CM

## 2019-05-30 DIAGNOSIS — I10 Essential (primary) hypertension: Secondary | ICD-10-CM | POA: Diagnosis not present

## 2019-05-30 DIAGNOSIS — H25811 Combined forms of age-related cataract, right eye: Secondary | ICD-10-CM

## 2019-05-30 DIAGNOSIS — E113393 Type 2 diabetes mellitus with moderate nonproliferative diabetic retinopathy without macular edema, bilateral: Secondary | ICD-10-CM | POA: Diagnosis not present

## 2019-05-30 DIAGNOSIS — H3581 Retinal edema: Secondary | ICD-10-CM

## 2019-05-30 DIAGNOSIS — H25813 Combined forms of age-related cataract, bilateral: Secondary | ICD-10-CM

## 2019-05-30 MED ORDER — AFLIBERCEPT 2MG/0.05ML IZ SOLN FOR KALEIDOSCOPE
2.0000 mg | INTRAVITREAL | Status: AC | PRN
  Administered 2019-05-30: 2 mg via INTRAVITREAL

## 2019-06-02 DIAGNOSIS — H353290 Exudative age-related macular degeneration, unspecified eye, stage unspecified: Secondary | ICD-10-CM | POA: Diagnosis not present

## 2019-06-02 DIAGNOSIS — Z6832 Body mass index (BMI) 32.0-32.9, adult: Secondary | ICD-10-CM | POA: Diagnosis not present

## 2019-06-02 DIAGNOSIS — D692 Other nonthrombocytopenic purpura: Secondary | ICD-10-CM | POA: Diagnosis not present

## 2019-06-02 DIAGNOSIS — I509 Heart failure, unspecified: Secondary | ICD-10-CM | POA: Diagnosis not present

## 2019-06-02 DIAGNOSIS — E261 Secondary hyperaldosteronism: Secondary | ICD-10-CM | POA: Diagnosis not present

## 2019-06-02 DIAGNOSIS — Z7902 Long term (current) use of antithrombotics/antiplatelets: Secondary | ICD-10-CM | POA: Diagnosis not present

## 2019-06-02 DIAGNOSIS — I209 Angina pectoris, unspecified: Secondary | ICD-10-CM | POA: Diagnosis not present

## 2019-06-02 DIAGNOSIS — F17200 Nicotine dependence, unspecified, uncomplicated: Secondary | ICD-10-CM | POA: Diagnosis not present

## 2019-06-02 DIAGNOSIS — F330 Major depressive disorder, recurrent, mild: Secondary | ICD-10-CM | POA: Diagnosis not present

## 2019-07-06 DIAGNOSIS — F17200 Nicotine dependence, unspecified, uncomplicated: Secondary | ICD-10-CM | POA: Diagnosis not present

## 2019-07-06 DIAGNOSIS — K589 Irritable bowel syndrome without diarrhea: Secondary | ICD-10-CM | POA: Diagnosis not present

## 2019-07-06 DIAGNOSIS — Z9981 Dependence on supplemental oxygen: Secondary | ICD-10-CM | POA: Diagnosis not present

## 2019-07-06 DIAGNOSIS — J449 Chronic obstructive pulmonary disease, unspecified: Secondary | ICD-10-CM | POA: Diagnosis not present

## 2019-07-06 DIAGNOSIS — Z7902 Long term (current) use of antithrombotics/antiplatelets: Secondary | ICD-10-CM | POA: Diagnosis not present

## 2019-07-06 DIAGNOSIS — Z6834 Body mass index (BMI) 34.0-34.9, adult: Secondary | ICD-10-CM | POA: Diagnosis not present

## 2019-07-06 DIAGNOSIS — J9610 Chronic respiratory failure, unspecified whether with hypoxia or hypercapnia: Secondary | ICD-10-CM | POA: Diagnosis not present

## 2019-07-06 DIAGNOSIS — I25118 Atherosclerotic heart disease of native coronary artery with other forms of angina pectoris: Secondary | ICD-10-CM | POA: Diagnosis not present

## 2019-07-06 DIAGNOSIS — D692 Other nonthrombocytopenic purpura: Secondary | ICD-10-CM | POA: Diagnosis not present

## 2019-07-10 ENCOUNTER — Other Ambulatory Visit: Payer: Self-pay

## 2019-07-10 ENCOUNTER — Ambulatory Visit (INDEPENDENT_AMBULATORY_CARE_PROVIDER_SITE_OTHER): Payer: Medicare HMO | Admitting: Ophthalmology

## 2019-07-10 DIAGNOSIS — H3581 Retinal edema: Secondary | ICD-10-CM | POA: Diagnosis not present

## 2019-07-10 DIAGNOSIS — H353231 Exudative age-related macular degeneration, bilateral, with active choroidal neovascularization: Secondary | ICD-10-CM

## 2019-07-10 DIAGNOSIS — Z961 Presence of intraocular lens: Secondary | ICD-10-CM | POA: Diagnosis not present

## 2019-07-10 DIAGNOSIS — E113393 Type 2 diabetes mellitus with moderate nonproliferative diabetic retinopathy without macular edema, bilateral: Secondary | ICD-10-CM

## 2019-07-10 DIAGNOSIS — I10 Essential (primary) hypertension: Secondary | ICD-10-CM | POA: Diagnosis not present

## 2019-07-10 DIAGNOSIS — H35033 Hypertensive retinopathy, bilateral: Secondary | ICD-10-CM
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
## 2019-07-13 ENCOUNTER — Encounter (INDEPENDENT_AMBULATORY_CARE_PROVIDER_SITE_OTHER): Payer: Self-pay | Admitting: Ophthalmology

## 2019-07-13 MED ORDER — AFLIBERCEPT 2MG/0.05ML IZ SOLN FOR KALEIDOSCOPE
2.0000 mg | INTRAVITREAL | Status: AC | PRN
  Administered 2019-07-13: 2 mg via INTRAVITREAL

## 2019-07-25 DIAGNOSIS — F17210 Nicotine dependence, cigarettes, uncomplicated: Secondary | ICD-10-CM | POA: Diagnosis not present

## 2019-07-25 DIAGNOSIS — N184 Chronic kidney disease, stage 4 (severe): Secondary | ICD-10-CM | POA: Diagnosis not present

## 2019-07-25 DIAGNOSIS — E1165 Type 2 diabetes mellitus with hyperglycemia: Secondary | ICD-10-CM | POA: Diagnosis not present

## 2019-07-25 DIAGNOSIS — I10 Essential (primary) hypertension: Secondary | ICD-10-CM | POA: Diagnosis not present

## 2019-07-25 DIAGNOSIS — E039 Hypothyroidism, unspecified: Secondary | ICD-10-CM | POA: Diagnosis not present

## 2019-07-25 DIAGNOSIS — E782 Mixed hyperlipidemia: Secondary | ICD-10-CM | POA: Diagnosis not present

## 2019-07-25 DIAGNOSIS — E1122 Type 2 diabetes mellitus with diabetic chronic kidney disease: Secondary | ICD-10-CM | POA: Diagnosis not present

## 2019-07-25 DIAGNOSIS — K219 Gastro-esophageal reflux disease without esophagitis: Secondary | ICD-10-CM | POA: Diagnosis not present

## 2019-07-25 DIAGNOSIS — E1142 Type 2 diabetes mellitus with diabetic polyneuropathy: Secondary | ICD-10-CM | POA: Diagnosis not present

## 2019-08-01 DIAGNOSIS — I714 Abdominal aortic aneurysm, without rupture: Secondary | ICD-10-CM | POA: Diagnosis not present

## 2019-08-01 DIAGNOSIS — Z6832 Body mass index (BMI) 32.0-32.9, adult: Secondary | ICD-10-CM | POA: Diagnosis not present

## 2019-08-01 DIAGNOSIS — E1122 Type 2 diabetes mellitus with diabetic chronic kidney disease: Secondary | ICD-10-CM | POA: Diagnosis not present

## 2019-08-01 DIAGNOSIS — G43909 Migraine, unspecified, not intractable, without status migrainosus: Secondary | ICD-10-CM | POA: Diagnosis not present

## 2019-08-01 DIAGNOSIS — G8194 Hemiplegia, unspecified affecting left nondominant side: Secondary | ICD-10-CM | POA: Diagnosis not present

## 2019-08-01 DIAGNOSIS — E1142 Type 2 diabetes mellitus with diabetic polyneuropathy: Secondary | ICD-10-CM | POA: Diagnosis not present

## 2019-08-01 DIAGNOSIS — E782 Mixed hyperlipidemia: Secondary | ICD-10-CM | POA: Diagnosis not present

## 2019-08-01 DIAGNOSIS — I10 Essential (primary) hypertension: Secondary | ICD-10-CM | POA: Diagnosis not present

## 2019-08-02 DIAGNOSIS — Z23 Encounter for immunization: Secondary | ICD-10-CM | POA: Diagnosis not present

## 2019-08-15 DIAGNOSIS — I714 Abdominal aortic aneurysm, without rupture: Secondary | ICD-10-CM | POA: Diagnosis not present
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
## 2019-08-16 DIAGNOSIS — F17200 Nicotine dependence, unspecified, uncomplicated: Secondary | ICD-10-CM | POA: Diagnosis not present

## 2019-08-16 DIAGNOSIS — J449 Chronic obstructive pulmonary disease, unspecified: Secondary | ICD-10-CM | POA: Diagnosis not present

## 2019-08-16 DIAGNOSIS — I209 Angina pectoris, unspecified: Secondary | ICD-10-CM | POA: Diagnosis not present

## 2019-08-16 DIAGNOSIS — Z6834 Body mass index (BMI) 34.0-34.9, adult: Secondary | ICD-10-CM | POA: Diagnosis not present

## 2019-08-16 DIAGNOSIS — D692 Other nonthrombocytopenic purpura: Secondary | ICD-10-CM | POA: Diagnosis not present

## 2019-08-16 DIAGNOSIS — J9610 Chronic respiratory failure, unspecified whether with hypoxia or hypercapnia: Secondary | ICD-10-CM | POA: Diagnosis not present

## 2019-08-16 DIAGNOSIS — I25118 Atherosclerotic heart disease of native coronary artery with other forms of angina pectoris: Secondary | ICD-10-CM | POA: Diagnosis not present

## 2019-08-16 DIAGNOSIS — I509 Heart failure, unspecified: Secondary | ICD-10-CM | POA: Diagnosis not present

## 2019-08-16 DIAGNOSIS — H353290 Exudative age-related macular degeneration, unspecified eye, stage unspecified: Secondary | ICD-10-CM | POA: Diagnosis not present

## 2019-08-16 DIAGNOSIS — Z515 Encounter for palliative care: Secondary | ICD-10-CM | POA: Diagnosis not present

## 2019-08-16 DIAGNOSIS — E261 Secondary hyperaldosteronism: Secondary | ICD-10-CM | POA: Diagnosis not present

## 2019-08-16 DIAGNOSIS — Z7902 Long term (current) use of antithrombotics/antiplatelets: Secondary | ICD-10-CM | POA: Diagnosis not present

## 2019-08-16 DIAGNOSIS — Z9981 Dependence on supplemental oxygen: Secondary | ICD-10-CM | POA: Diagnosis not present

## 2019-08-16 DIAGNOSIS — K589 Irritable bowel syndrome without diarrhea: Secondary | ICD-10-CM | POA: Diagnosis not present

## 2019-08-16 DIAGNOSIS — Z7951 Long term (current) use of inhaled steroids: Secondary | ICD-10-CM | POA: Diagnosis not present

## 2019-08-16 DIAGNOSIS — F330 Major depressive disorder, recurrent, mild: Secondary | ICD-10-CM | POA: Diagnosis not present

## 2019-08-17 ENCOUNTER — Ambulatory Visit (INDEPENDENT_AMBULATORY_CARE_PROVIDER_SITE_OTHER): Payer: Medicare HMO | Admitting: Ophthalmology

## 2019-08-17 ENCOUNTER — Other Ambulatory Visit: Payer: Self-pay

## 2019-08-17 ENCOUNTER — Encounter (INDEPENDENT_AMBULATORY_CARE_PROVIDER_SITE_OTHER): Payer: Self-pay | Admitting: Ophthalmology

## 2019-08-17 DIAGNOSIS — H35033 Hypertensive retinopathy, bilateral: Secondary | ICD-10-CM | POA: Diagnosis not present

## 2019-08-17 DIAGNOSIS — H3581 Retinal edema: Secondary | ICD-10-CM | POA: Diagnosis not present

## 2019-08-17 DIAGNOSIS — E113393 Type 2 diabetes mellitus with moderate nonproliferative diabetic retinopathy without macular edema, bilateral: Secondary | ICD-10-CM

## 2019-08-17 DIAGNOSIS — I10 Essential (primary) hypertension: Secondary | ICD-10-CM

## 2019-08-17 DIAGNOSIS — Z961 Presence of intraocular lens: Secondary | ICD-10-CM

## 2019-08-17 DIAGNOSIS — H353231 Exudative age-related macular degeneration, bilateral, with active choroidal neovascularization: Secondary | ICD-10-CM | POA: Diagnosis not present

## 2019-08-18 MED ORDER — AFLIBERCEPT 2MG/0.05ML IZ SOLN FOR KALEIDOSCOPE
2.0000 mg | INTRAVITREAL | Status: AC | PRN
  Administered 2019-08-18: 2 mg via INTRAVITREAL

## 2019-08-30 DIAGNOSIS — Z23 Encounter for immunization: Secondary | ICD-10-CM | POA: Diagnosis not present
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
## 2019-09-19 ENCOUNTER — Other Ambulatory Visit: Payer: Self-pay

## 2019-09-19 ENCOUNTER — Encounter (INDEPENDENT_AMBULATORY_CARE_PROVIDER_SITE_OTHER): Payer: Self-pay | Admitting: Ophthalmology

## 2019-09-19 ENCOUNTER — Ambulatory Visit (INDEPENDENT_AMBULATORY_CARE_PROVIDER_SITE_OTHER): Payer: Medicare HMO | Admitting: Ophthalmology

## 2019-09-19 DIAGNOSIS — E113393 Type 2 diabetes mellitus with moderate nonproliferative diabetic retinopathy without macular edema, bilateral: Secondary | ICD-10-CM | POA: Diagnosis not present

## 2019-09-19 DIAGNOSIS — I10 Essential (primary) hypertension: Secondary | ICD-10-CM | POA: Diagnosis not present

## 2019-09-19 DIAGNOSIS — H3581 Retinal edema: Secondary | ICD-10-CM

## 2019-09-19 DIAGNOSIS — H35033 Hypertensive retinopathy, bilateral: Secondary | ICD-10-CM

## 2019-09-19 DIAGNOSIS — Z961 Presence of intraocular lens: Secondary | ICD-10-CM | POA: Diagnosis not present

## 2019-09-19 DIAGNOSIS — H353231 Exudative age-related macular degeneration, bilateral, with active choroidal neovascularization: Secondary | ICD-10-CM | POA: Diagnosis not present

## 2019-09-19 MED ORDER — AFLIBERCEPT 2MG/0.05ML IZ SOLN FOR KALEIDOSCOPE
2.0000 mg | INTRAVITREAL | Status: AC | PRN
  Administered 2019-09-19: 2 mg via INTRAVITREAL

## 2019-09-21 ENCOUNTER — Encounter (INDEPENDENT_AMBULATORY_CARE_PROVIDER_SITE_OTHER): Payer: Medicare HMO | Admitting: Ophthalmology

## 2019-10-23 ENCOUNTER — Encounter (INDEPENDENT_AMBULATORY_CARE_PROVIDER_SITE_OTHER): Payer: Medicare HMO | Admitting: Ophthalmology

## 2019-10-23 DIAGNOSIS — Z6835 Body mass index (BMI) 35.0-35.9, adult: Secondary | ICD-10-CM | POA: Diagnosis not present

## 2019-10-23 DIAGNOSIS — I509 Heart failure, unspecified: Secondary | ICD-10-CM | POA: Diagnosis not present

## 2019-10-23 DIAGNOSIS — I25118 Atherosclerotic heart disease of native coronary artery with other forms of angina pectoris: Secondary | ICD-10-CM | POA: Diagnosis not present

## 2019-10-23 DIAGNOSIS — Z9981 Dependence on supplemental oxygen: Secondary | ICD-10-CM | POA: Diagnosis not present

## 2019-10-23 DIAGNOSIS — J9610 Chronic respiratory failure, unspecified whether with hypoxia or hypercapnia: Secondary | ICD-10-CM | POA: Diagnosis not present

## 2019-10-23 DIAGNOSIS — E261 Secondary hyperaldosteronism: Secondary | ICD-10-CM | POA: Diagnosis not present

## 2019-10-23 DIAGNOSIS — F17200 Nicotine dependence, unspecified, uncomplicated: Secondary | ICD-10-CM | POA: Diagnosis not present

## 2019-10-23 DIAGNOSIS — D692 Other nonthrombocytopenic purpura: Secondary | ICD-10-CM | POA: Diagnosis not present

## 2019-10-23 DIAGNOSIS — K589 Irritable bowel syndrome without diarrhea: Secondary | ICD-10-CM | POA: Diagnosis not present

## 2019-10-23 DIAGNOSIS — F330 Major depressive disorder, recurrent, mild: Secondary | ICD-10-CM | POA: Diagnosis not present

## 2019-10-23 DIAGNOSIS — Z515 Encounter for palliative care: Secondary | ICD-10-CM | POA: Diagnosis not present

## 2019-10-23 DIAGNOSIS — Z7902 Long term (current) use of antithrombotics/antiplatelets: Secondary | ICD-10-CM | POA: Diagnosis not present

## 2019-10-23 DIAGNOSIS — J449 Chronic obstructive pulmonary disease, unspecified: Secondary | ICD-10-CM | POA: Diagnosis not present
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
## 2019-10-26 ENCOUNTER — Other Ambulatory Visit: Payer: Self-pay

## 2019-10-26 ENCOUNTER — Encounter (INDEPENDENT_AMBULATORY_CARE_PROVIDER_SITE_OTHER): Payer: Self-pay | Admitting: Ophthalmology

## 2019-10-26 ENCOUNTER — Ambulatory Visit (INDEPENDENT_AMBULATORY_CARE_PROVIDER_SITE_OTHER): Payer: Medicare HMO | Admitting: Ophthalmology

## 2019-10-26 DIAGNOSIS — Z961 Presence of intraocular lens: Secondary | ICD-10-CM | POA: Diagnosis not present

## 2019-10-26 DIAGNOSIS — H353231 Exudative age-related macular degeneration, bilateral, with active choroidal neovascularization: Secondary | ICD-10-CM | POA: Diagnosis not present

## 2019-10-26 DIAGNOSIS — I10 Essential (primary) hypertension: Secondary | ICD-10-CM

## 2019-10-26 DIAGNOSIS — E113393 Type 2 diabetes mellitus with moderate nonproliferative diabetic retinopathy without macular edema, bilateral: Secondary | ICD-10-CM | POA: Diagnosis not present

## 2019-10-26 DIAGNOSIS — H3581 Retinal edema: Secondary | ICD-10-CM

## 2019-10-26 DIAGNOSIS — H35033 Hypertensive retinopathy, bilateral: Secondary | ICD-10-CM | POA: Diagnosis not present

## 2019-10-26 MED ORDER — AFLIBERCEPT 2MG/0.05ML IZ SOLN FOR KALEIDOSCOPE
2.0000 mg | INTRAVITREAL | Status: AC | PRN
  Administered 2019-10-26: 2 mg via INTRAVITREAL

## 2019-11-01 DIAGNOSIS — E039 Hypothyroidism, unspecified: Secondary | ICD-10-CM | POA: Diagnosis not present

## 2019-11-01 DIAGNOSIS — E1142 Type 2 diabetes mellitus with diabetic polyneuropathy: Secondary | ICD-10-CM | POA: Diagnosis not present

## 2019-11-01 DIAGNOSIS — I10 Essential (primary) hypertension: Secondary | ICD-10-CM | POA: Diagnosis not present

## 2019-11-01 DIAGNOSIS — E1122 Type 2 diabetes mellitus with diabetic chronic kidney disease: Secondary | ICD-10-CM | POA: Diagnosis not present

## 2019-11-01 DIAGNOSIS — N184 Chronic kidney disease, stage 4 (severe): Secondary | ICD-10-CM | POA: Diagnosis not present

## 2019-11-01 DIAGNOSIS — E782 Mixed hyperlipidemia: Secondary | ICD-10-CM | POA: Diagnosis not present

## 2019-11-01 DIAGNOSIS — E1165 Type 2 diabetes mellitus with hyperglycemia: Secondary | ICD-10-CM | POA: Diagnosis not present

## 2019-11-01 DIAGNOSIS — K219 Gastro-esophageal reflux disease without esophagitis: Secondary | ICD-10-CM | POA: Diagnosis not present

## 2019-11-05 DIAGNOSIS — R260 Ataxic gait: Secondary | ICD-10-CM | POA: Diagnosis not present

## 2019-11-05 DIAGNOSIS — E1122 Type 2 diabetes mellitus with diabetic chronic kidney disease: Secondary | ICD-10-CM | POA: Diagnosis not present

## 2019-11-05 DIAGNOSIS — E1142 Type 2 diabetes mellitus with diabetic polyneuropathy: Secondary | ICD-10-CM | POA: Diagnosis not present

## 2019-11-05 DIAGNOSIS — J9611 Chronic respiratory failure with hypoxia: Secondary | ICD-10-CM | POA: Diagnosis not present

## 2019-11-05 DIAGNOSIS — Z23 Encounter for immunization: Secondary | ICD-10-CM | POA: Diagnosis not present

## 2019-11-05 DIAGNOSIS — R4582 Worries: Secondary | ICD-10-CM | POA: Diagnosis not present

## 2019-11-05 DIAGNOSIS — I10 Essential (primary) hypertension: Secondary | ICD-10-CM | POA: Diagnosis not present

## 2019-11-05 DIAGNOSIS — N1832 Chronic kidney disease, stage 3b: Secondary | ICD-10-CM | POA: Diagnosis not present

## 2019-11-05 DIAGNOSIS — G8194 Hemiplegia, unspecified affecting left nondominant side: Secondary | ICD-10-CM | POA: Diagnosis not present

## 2019-11-30 ENCOUNTER — Encounter (INDEPENDENT_AMBULATORY_CARE_PROVIDER_SITE_OTHER): Payer: Medicare HMO | Admitting: Ophthalmology

## 2019-11-30 DIAGNOSIS — H35033 Hypertensive retinopathy, bilateral: Secondary | ICD-10-CM

## 2019-11-30 DIAGNOSIS — I10 Essential (primary) hypertension: Secondary | ICD-10-CM

## 2019-11-30 DIAGNOSIS — E113393 Type 2 diabetes mellitus with moderate nonproliferative diabetic retinopathy without macular edema, bilateral: Secondary | ICD-10-CM

## 2019-11-30 DIAGNOSIS — H353231 Exudative age-related macular degeneration, bilateral, with active choroidal neovascularization: Secondary | ICD-10-CM

## 2019-11-30 DIAGNOSIS — Z961 Presence of intraocular lens: Secondary | ICD-10-CM

## 2019-11-30 DIAGNOSIS — H3581 Retinal edema: Secondary | ICD-10-CM

## 2019-12-05 DIAGNOSIS — J9610 Chronic respiratory failure, unspecified whether with hypoxia or hypercapnia: Secondary | ICD-10-CM | POA: Diagnosis not present

## 2019-12-05 DIAGNOSIS — F17200 Nicotine dependence, unspecified, uncomplicated: Secondary | ICD-10-CM | POA: Diagnosis not present

## 2019-12-05 DIAGNOSIS — Z515 Encounter for palliative care: Secondary | ICD-10-CM | POA: Diagnosis not present

## 2019-12-05 DIAGNOSIS — J449 Chronic obstructive pulmonary disease, unspecified: Secondary | ICD-10-CM | POA: Diagnosis not present

## 2019-12-05 DIAGNOSIS — Z9981 Dependence on supplemental oxygen: Secondary | ICD-10-CM | POA: Diagnosis not present

## 2019-12-05 DIAGNOSIS — I25118 Atherosclerotic heart disease of native coronary artery with other forms of angina pectoris: Secondary | ICD-10-CM | POA: Diagnosis not present
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
## 2019-12-10 ENCOUNTER — Encounter (INDEPENDENT_AMBULATORY_CARE_PROVIDER_SITE_OTHER): Payer: Self-pay | Admitting: Ophthalmology

## 2019-12-10 ENCOUNTER — Ambulatory Visit (INDEPENDENT_AMBULATORY_CARE_PROVIDER_SITE_OTHER): Payer: Medicare HMO | Admitting: Ophthalmology

## 2019-12-10 ENCOUNTER — Other Ambulatory Visit: Payer: Self-pay

## 2019-12-10 DIAGNOSIS — E113393 Type 2 diabetes mellitus with moderate nonproliferative diabetic retinopathy without macular edema, bilateral: Secondary | ICD-10-CM

## 2019-12-10 DIAGNOSIS — H35033 Hypertensive retinopathy, bilateral: Secondary | ICD-10-CM | POA: Diagnosis not present

## 2019-12-10 DIAGNOSIS — Z961 Presence of intraocular lens: Secondary | ICD-10-CM

## 2019-12-10 DIAGNOSIS — H3581 Retinal edema: Secondary | ICD-10-CM | POA: Diagnosis not present

## 2019-12-10 DIAGNOSIS — H25811 Combined forms of age-related cataract, right eye: Secondary | ICD-10-CM

## 2019-12-10 DIAGNOSIS — H353231 Exudative age-related macular degeneration, bilateral, with active choroidal neovascularization: Secondary | ICD-10-CM | POA: Diagnosis not present

## 2019-12-10 DIAGNOSIS — I10 Essential (primary) hypertension: Secondary | ICD-10-CM

## 2019-12-10 DIAGNOSIS — H04123 Dry eye syndrome of bilateral lacrimal glands: Secondary | ICD-10-CM | POA: Diagnosis not present

## 2019-12-10 DIAGNOSIS — H25813 Combined forms of age-related cataract, bilateral: Secondary | ICD-10-CM

## 2019-12-10 MED ORDER — AFLIBERCEPT 2MG/0.05ML IZ SOLN FOR KALEIDOSCOPE
2.0000 mg | INTRAVITREAL | Status: AC | PRN
  Administered 2019-12-10: 2 mg via INTRAVITREAL

## 2020-01-28 ENCOUNTER — Encounter (INDEPENDENT_AMBULATORY_CARE_PROVIDER_SITE_OTHER): Payer: Medicare HMO | Admitting: Ophthalmology

## 2020-01-29 DIAGNOSIS — N184 Chronic kidney disease, stage 4 (severe): Secondary | ICD-10-CM | POA: Diagnosis not present

## 2020-01-29 DIAGNOSIS — E039 Hypothyroidism, unspecified: Secondary | ICD-10-CM | POA: Diagnosis not present

## 2020-01-29 DIAGNOSIS — E782 Mixed hyperlipidemia: Secondary | ICD-10-CM | POA: Diagnosis not present

## 2020-01-29 DIAGNOSIS — Z1159 Encounter for screening for other viral diseases: Secondary | ICD-10-CM | POA: Diagnosis not present

## 2020-01-29 DIAGNOSIS — D649 Anemia, unspecified: Secondary | ICD-10-CM | POA: Diagnosis not present

## 2020-01-29 DIAGNOSIS — I10 Essential (primary) hypertension: Secondary | ICD-10-CM | POA: Diagnosis not present

## 2020-01-29 DIAGNOSIS — J449 Chronic obstructive pulmonary disease, unspecified: Secondary | ICD-10-CM | POA: Diagnosis not present
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
## 2020-02-12 DIAGNOSIS — G8194 Hemiplegia, unspecified affecting left nondominant side: Secondary | ICD-10-CM | POA: Diagnosis not present

## 2020-02-12 DIAGNOSIS — F17200 Nicotine dependence, unspecified, uncomplicated: Secondary | ICD-10-CM | POA: Diagnosis not present

## 2020-02-12 DIAGNOSIS — E1122 Type 2 diabetes mellitus with diabetic chronic kidney disease: Secondary | ICD-10-CM | POA: Diagnosis not present

## 2020-02-12 DIAGNOSIS — I714 Abdominal aortic aneurysm, without rupture: Secondary | ICD-10-CM | POA: Diagnosis not present

## 2020-02-12 DIAGNOSIS — I509 Heart failure, unspecified: Secondary | ICD-10-CM | POA: Diagnosis not present

## 2020-02-12 DIAGNOSIS — J449 Chronic obstructive pulmonary disease, unspecified: Secondary | ICD-10-CM | POA: Diagnosis not present

## 2020-02-12 DIAGNOSIS — H353290 Exudative age-related macular degeneration, unspecified eye, stage unspecified: Secondary | ICD-10-CM | POA: Diagnosis not present

## 2020-02-12 DIAGNOSIS — D692 Other nonthrombocytopenic purpura: Secondary | ICD-10-CM | POA: Diagnosis not present

## 2020-02-12 DIAGNOSIS — I25118 Atherosclerotic heart disease of native coronary artery with other forms of angina pectoris: Secondary | ICD-10-CM | POA: Diagnosis not present

## 2020-02-12 DIAGNOSIS — F339 Major depressive disorder, recurrent, unspecified: Secondary | ICD-10-CM | POA: Diagnosis not present

## 2020-02-15 ENCOUNTER — Other Ambulatory Visit: Payer: Self-pay

## 2020-02-15 ENCOUNTER — Ambulatory Visit (INDEPENDENT_AMBULATORY_CARE_PROVIDER_SITE_OTHER): Payer: Medicare HMO | Admitting: Ophthalmology

## 2020-02-15 ENCOUNTER — Encounter (INDEPENDENT_AMBULATORY_CARE_PROVIDER_SITE_OTHER): Payer: Self-pay | Admitting: Ophthalmology

## 2020-02-15 DIAGNOSIS — H04123 Dry eye syndrome of bilateral lacrimal glands: Secondary | ICD-10-CM

## 2020-02-15 DIAGNOSIS — H3581 Retinal edema: Secondary | ICD-10-CM

## 2020-02-15 DIAGNOSIS — Z961 Presence of intraocular lens: Secondary | ICD-10-CM | POA: Diagnosis not present

## 2020-02-15 DIAGNOSIS — I10 Essential (primary) hypertension: Secondary | ICD-10-CM | POA: Diagnosis not present

## 2020-02-15 DIAGNOSIS — H25811 Combined forms of age-related cataract, right eye: Secondary | ICD-10-CM

## 2020-02-15 DIAGNOSIS — E113393 Type 2 diabetes mellitus with moderate nonproliferative diabetic retinopathy without macular edema, bilateral: Secondary | ICD-10-CM

## 2020-02-15 DIAGNOSIS — H353231 Exudative age-related macular degeneration, bilateral, with active choroidal neovascularization: Secondary | ICD-10-CM | POA: Diagnosis not present

## 2020-02-15 DIAGNOSIS — H35033 Hypertensive retinopathy, bilateral: Secondary | ICD-10-CM | POA: Diagnosis not present

## 2020-02-15 MED ORDER — AFLIBERCEPT 2MG/0.05ML IZ SOLN FOR KALEIDOSCOPE
2.0000 mg | INTRAVITREAL | Status: AC | PRN
  Administered 2020-02-15: 2 mg via INTRAVITREAL

## 2020-02-19 DIAGNOSIS — E1142 Type 2 diabetes mellitus with diabetic polyneuropathy: Secondary | ICD-10-CM | POA: Diagnosis not present

## 2020-02-19 DIAGNOSIS — E1122 Type 2 diabetes mellitus with diabetic chronic kidney disease: Secondary | ICD-10-CM | POA: Diagnosis not present

## 2020-02-19 DIAGNOSIS — I10 Essential (primary) hypertension: Secondary | ICD-10-CM | POA: Diagnosis not present

## 2020-02-19 DIAGNOSIS — Z6833 Body mass index (BMI) 33.0-33.9, adult: Secondary | ICD-10-CM | POA: Diagnosis not present

## 2020-02-19 DIAGNOSIS — Z0001 Encounter for general adult medical examination with abnormal findings: Secondary | ICD-10-CM | POA: Diagnosis not present

## 2020-02-19 DIAGNOSIS — E7849 Other hyperlipidemia: Secondary | ICD-10-CM | POA: Diagnosis not present

## 2020-02-19 DIAGNOSIS — F17210 Nicotine dependence, cigarettes, uncomplicated: Secondary | ICD-10-CM | POA: Diagnosis not present

## 2020-02-19 DIAGNOSIS — E6609 Other obesity due to excess calories: Secondary | ICD-10-CM | POA: Diagnosis not present

## 2020-02-19 DIAGNOSIS — G4733 Obstructive sleep apnea (adult) (pediatric): Secondary | ICD-10-CM | POA: Diagnosis not present

## 2020-02-19 DIAGNOSIS — E782 Mixed hyperlipidemia: Secondary | ICD-10-CM | POA: Diagnosis not present

## 2020-02-19 DIAGNOSIS — F331 Major depressive disorder, recurrent, moderate: Secondary | ICD-10-CM | POA: Diagnosis not present

## 2020-02-19 DIAGNOSIS — G43909 Migraine, unspecified, not intractable, without status migrainosus: Secondary | ICD-10-CM | POA: Diagnosis not present

## 2020-03-03 DIAGNOSIS — Z23 Encounter for immunization: Secondary | ICD-10-CM | POA: Diagnosis not present

## 2020-03-06 DIAGNOSIS — Z9049 Acquired absence of other specified parts of digestive tract: Secondary | ICD-10-CM | POA: Diagnosis not present

## 2020-03-06 DIAGNOSIS — R918 Other nonspecific abnormal finding of lung field: Secondary | ICD-10-CM | POA: Diagnosis not present

## 2020-03-06 DIAGNOSIS — F17210 Nicotine dependence, cigarettes, uncomplicated: Secondary | ICD-10-CM | POA: Diagnosis not present

## 2020-03-06 DIAGNOSIS — R93421 Abnormal radiologic findings on diagnostic imaging of right kidney: Secondary | ICD-10-CM | POA: Diagnosis not present

## 2020-03-06 DIAGNOSIS — R109 Unspecified abdominal pain: Secondary | ICD-10-CM | POA: Diagnosis not present

## 2020-03-06 DIAGNOSIS — J439 Emphysema, unspecified: Secondary | ICD-10-CM | POA: Diagnosis not present

## 2020-03-06 DIAGNOSIS — R932 Abnormal findings on diagnostic imaging of liver and biliary tract: Secondary | ICD-10-CM | POA: Diagnosis not present

## 2020-03-06 DIAGNOSIS — I700 Atherosclerosis of aorta: Secondary | ICD-10-CM | POA: Diagnosis not present

## 2020-03-06 DIAGNOSIS — I714 Abdominal aortic aneurysm, without rupture: Secondary | ICD-10-CM | POA: Diagnosis not present
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
## 2020-03-28 ENCOUNTER — Encounter (INDEPENDENT_AMBULATORY_CARE_PROVIDER_SITE_OTHER): Payer: Medicare HMO | Admitting: Ophthalmology

## 2020-03-28 DIAGNOSIS — H353231 Exudative age-related macular degeneration, bilateral, with active choroidal neovascularization: Secondary | ICD-10-CM

## 2020-03-28 DIAGNOSIS — Z961 Presence of intraocular lens: Secondary | ICD-10-CM

## 2020-03-28 DIAGNOSIS — H25811 Combined forms of age-related cataract, right eye: Secondary | ICD-10-CM

## 2020-03-28 DIAGNOSIS — H04123 Dry eye syndrome of bilateral lacrimal glands: Secondary | ICD-10-CM

## 2020-03-28 DIAGNOSIS — H35033 Hypertensive retinopathy, bilateral: Secondary | ICD-10-CM

## 2020-03-28 DIAGNOSIS — H3581 Retinal edema: Secondary | ICD-10-CM

## 2020-03-28 DIAGNOSIS — E113393 Type 2 diabetes mellitus with moderate nonproliferative diabetic retinopathy without macular edema, bilateral: Secondary | ICD-10-CM

## 2020-03-28 DIAGNOSIS — I10 Essential (primary) hypertension: Secondary | ICD-10-CM
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
## 2020-04-01 ENCOUNTER — Encounter (INDEPENDENT_AMBULATORY_CARE_PROVIDER_SITE_OTHER): Payer: Medicare HMO | Admitting: Ophthalmology

## 2020-04-01 ENCOUNTER — Encounter (INDEPENDENT_AMBULATORY_CARE_PROVIDER_SITE_OTHER): Payer: Self-pay

## 2020-04-02 DIAGNOSIS — J209 Acute bronchitis, unspecified: Secondary | ICD-10-CM | POA: Diagnosis not present

## 2020-04-02 DIAGNOSIS — J440 Chronic obstructive pulmonary disease with acute lower respiratory infection: Secondary | ICD-10-CM | POA: Diagnosis not present

## 2020-04-02 DIAGNOSIS — J9811 Atelectasis: Secondary | ICD-10-CM | POA: Diagnosis not present

## 2020-04-02 DIAGNOSIS — F17210 Nicotine dependence, cigarettes, uncomplicated: Secondary | ICD-10-CM | POA: Diagnosis not present

## 2020-04-02 DIAGNOSIS — Z720 Tobacco use: Secondary | ICD-10-CM | POA: Diagnosis not present

## 2020-04-02 DIAGNOSIS — Z9049 Acquired absence of other specified parts of digestive tract: Secondary | ICD-10-CM | POA: Diagnosis not present

## 2020-04-02 DIAGNOSIS — R9431 Abnormal electrocardiogram [ECG] [EKG]: Secondary | ICD-10-CM | POA: Diagnosis not present

## 2020-04-02 DIAGNOSIS — R0602 Shortness of breath: Secondary | ICD-10-CM | POA: Diagnosis not present

## 2020-04-02 DIAGNOSIS — Z9981 Dependence on supplemental oxygen: Secondary | ICD-10-CM | POA: Diagnosis not present

## 2020-04-11 DIAGNOSIS — E876 Hypokalemia: Secondary | ICD-10-CM | POA: Diagnosis not present

## 2020-04-11 DIAGNOSIS — K2100 Gastro-esophageal reflux disease with esophagitis, without bleeding: Secondary | ICD-10-CM | POA: Diagnosis not present

## 2020-04-11 DIAGNOSIS — N1832 Chronic kidney disease, stage 3b: Secondary | ICD-10-CM | POA: Diagnosis not present

## 2020-04-11 DIAGNOSIS — R946 Abnormal results of thyroid function studies: Secondary | ICD-10-CM | POA: Diagnosis not present

## 2020-04-11 DIAGNOSIS — D649 Anemia, unspecified: Secondary | ICD-10-CM | POA: Diagnosis not present

## 2020-04-11 DIAGNOSIS — I10 Essential (primary) hypertension: Secondary | ICD-10-CM | POA: Diagnosis not present

## 2020-04-11 DIAGNOSIS — E1142 Type 2 diabetes mellitus with diabetic polyneuropathy: Secondary | ICD-10-CM | POA: Diagnosis not present

## 2020-04-15 DIAGNOSIS — J449 Chronic obstructive pulmonary disease, unspecified: Secondary | ICD-10-CM | POA: Diagnosis not present

## 2020-04-15 DIAGNOSIS — G43909 Migraine, unspecified, not intractable, without status migrainosus: Secondary | ICD-10-CM | POA: Diagnosis not present

## 2020-04-15 DIAGNOSIS — E1122 Type 2 diabetes mellitus with diabetic chronic kidney disease: Secondary | ICD-10-CM | POA: Diagnosis not present

## 2020-04-15 DIAGNOSIS — G4733 Obstructive sleep apnea (adult) (pediatric): Secondary | ICD-10-CM | POA: Diagnosis not present

## 2020-04-15 DIAGNOSIS — Z0001 Encounter for general adult medical examination with abnormal findings: Secondary | ICD-10-CM | POA: Diagnosis not present

## 2020-04-15 DIAGNOSIS — Z1331 Encounter for screening for depression: Secondary | ICD-10-CM | POA: Diagnosis not present

## 2020-04-15 DIAGNOSIS — F331 Major depressive disorder, recurrent, moderate: Secondary | ICD-10-CM | POA: Diagnosis not present

## 2020-04-15 DIAGNOSIS — E7849 Other hyperlipidemia: Secondary | ICD-10-CM | POA: Diagnosis not present

## 2020-04-15 DIAGNOSIS — E782 Mixed hyperlipidemia: Secondary | ICD-10-CM | POA: Diagnosis not present

## 2020-04-15 DIAGNOSIS — Z1389 Encounter for screening for other disorder: Secondary | ICD-10-CM | POA: Diagnosis not present

## 2020-04-15 DIAGNOSIS — E6609 Other obesity due to excess calories: Secondary | ICD-10-CM | POA: Diagnosis not present

## 2020-04-15 DIAGNOSIS — I714 Abdominal aortic aneurysm, without rupture: Secondary | ICD-10-CM | POA: Diagnosis not present

## 2020-04-15 DIAGNOSIS — F17210 Nicotine dependence, cigarettes, uncomplicated: Secondary | ICD-10-CM | POA: Diagnosis not present

## 2020-04-15 DIAGNOSIS — E1142 Type 2 diabetes mellitus with diabetic polyneuropathy: Secondary | ICD-10-CM | POA: Diagnosis not present

## 2020-04-15 DIAGNOSIS — I10 Essential (primary) hypertension: Secondary | ICD-10-CM | POA: Diagnosis not present

## 2020-05-19 DIAGNOSIS — F331 Major depressive disorder, recurrent, moderate: Secondary | ICD-10-CM | POA: Diagnosis not present

## 2020-05-19 DIAGNOSIS — D751 Secondary polycythemia: Secondary | ICD-10-CM | POA: Diagnosis not present

## 2020-05-19 DIAGNOSIS — R410 Disorientation, unspecified: Secondary | ICD-10-CM | POA: Diagnosis not present

## 2020-05-19 DIAGNOSIS — R059 Cough, unspecified: Secondary | ICD-10-CM | POA: Diagnosis not present

## 2020-05-19 DIAGNOSIS — R935 Abnormal findings on diagnostic imaging of other abdominal regions, including retroperitoneum: Secondary | ICD-10-CM | POA: Diagnosis not present

## 2020-05-19 DIAGNOSIS — J9601 Acute respiratory failure with hypoxia: Secondary | ICD-10-CM | POA: Diagnosis not present

## 2020-05-19 DIAGNOSIS — Z0389 Encounter for observation for other suspected diseases and conditions ruled out: Secondary | ICD-10-CM | POA: Diagnosis not present

## 2020-05-19 DIAGNOSIS — F0150 Vascular dementia without behavioral disturbance: Secondary | ICD-10-CM | POA: Diagnosis not present

## 2020-05-19 DIAGNOSIS — N1832 Chronic kidney disease, stage 3b: Secondary | ICD-10-CM | POA: Diagnosis not present

## 2020-05-19 DIAGNOSIS — J449 Chronic obstructive pulmonary disease, unspecified: Secondary | ICD-10-CM | POA: Diagnosis not present

## 2020-05-19 DIAGNOSIS — J9621 Acute and chronic respiratory failure with hypoxia: Secondary | ICD-10-CM | POA: Diagnosis not present

## 2020-05-19 DIAGNOSIS — I10 Essential (primary) hypertension: Secondary | ICD-10-CM | POA: Diagnosis not present

## 2020-05-19 DIAGNOSIS — F05 Delirium due to known physiological condition: Secondary | ICD-10-CM | POA: Diagnosis not present

## 2020-05-19 DIAGNOSIS — R0600 Dyspnea, unspecified: Secondary | ICD-10-CM | POA: Diagnosis not present

## 2020-05-19 DIAGNOSIS — J439 Emphysema, unspecified: Secondary | ICD-10-CM | POA: Diagnosis not present

## 2020-05-19 DIAGNOSIS — J441 Chronic obstructive pulmonary disease with (acute) exacerbation: Secondary | ICD-10-CM | POA: Diagnosis not present

## 2020-05-19 DIAGNOSIS — Z9049 Acquired absence of other specified parts of digestive tract: Secondary | ICD-10-CM | POA: Diagnosis not present

## 2020-05-19 DIAGNOSIS — R0602 Shortness of breath: Secondary | ICD-10-CM | POA: Diagnosis not present

## 2020-05-19 DIAGNOSIS — R4182 Altered mental status, unspecified: Secondary | ICD-10-CM | POA: Diagnosis not present

## 2020-05-19 DIAGNOSIS — J440 Chronic obstructive pulmonary disease with acute lower respiratory infection: Secondary | ICD-10-CM | POA: Diagnosis not present

## 2020-05-19 DIAGNOSIS — I509 Heart failure, unspecified: Secondary | ICD-10-CM | POA: Diagnosis not present

## 2020-05-19 DIAGNOSIS — E1122 Type 2 diabetes mellitus with diabetic chronic kidney disease: Secondary | ICD-10-CM | POA: Diagnosis not present

## 2020-05-19 DIAGNOSIS — I69354 Hemiplegia and hemiparesis following cerebral infarction affecting left non-dominant side: Secondary | ICD-10-CM | POA: Diagnosis not present

## 2020-05-20 DIAGNOSIS — R0600 Dyspnea, unspecified: Secondary | ICD-10-CM | POA: Diagnosis not present

## 2020-06-09 DIAGNOSIS — I509 Heart failure, unspecified: Secondary | ICD-10-CM | POA: Diagnosis not present

## 2020-06-09 DIAGNOSIS — Z515 Encounter for palliative care: Secondary | ICD-10-CM | POA: Diagnosis not present

## 2020-06-09 DIAGNOSIS — J449 Chronic obstructive pulmonary disease, unspecified: Secondary | ICD-10-CM | POA: Diagnosis not present

## 2020-06-09 DIAGNOSIS — I69354 Hemiplegia and hemiparesis following cerebral infarction affecting left non-dominant side: Secondary | ICD-10-CM | POA: Diagnosis not present

## 2020-06-09 DIAGNOSIS — I2510 Atherosclerotic heart disease of native coronary artery without angina pectoris: Secondary | ICD-10-CM | POA: Diagnosis not present

## 2020-06-09 DIAGNOSIS — J9610 Chronic respiratory failure, unspecified whether with hypoxia or hypercapnia: Secondary | ICD-10-CM | POA: Diagnosis not present

## 2020-06-09 DIAGNOSIS — N183 Chronic kidney disease, stage 3 unspecified: Secondary | ICD-10-CM | POA: Diagnosis not present

## 2020-06-09 DIAGNOSIS — E261 Secondary hyperaldosteronism: Secondary | ICD-10-CM | POA: Diagnosis not present

## 2020-06-09 DIAGNOSIS — Z7902 Long term (current) use of antithrombotics/antiplatelets: Secondary | ICD-10-CM | POA: Diagnosis not present

## 2020-06-09 DIAGNOSIS — Z8679 Personal history of other diseases of the circulatory system: Secondary | ICD-10-CM | POA: Diagnosis not present

## 2020-06-09 DIAGNOSIS — D692 Other nonthrombocytopenic purpura: Secondary | ICD-10-CM | POA: Diagnosis not present

## 2020-06-09 DIAGNOSIS — Z9981 Dependence on supplemental oxygen: Secondary | ICD-10-CM | POA: Diagnosis not present

## 2020-06-12 DIAGNOSIS — D631 Anemia in chronic kidney disease: Secondary | ICD-10-CM | POA: Diagnosis not present

## 2020-06-12 DIAGNOSIS — N184 Chronic kidney disease, stage 4 (severe): Secondary | ICD-10-CM | POA: Diagnosis not present

## 2020-06-12 DIAGNOSIS — I69318 Other symptoms and signs involving cognitive functions following cerebral infarction: Secondary | ICD-10-CM | POA: Diagnosis not present

## 2020-06-12 DIAGNOSIS — E1122 Type 2 diabetes mellitus with diabetic chronic kidney disease: Secondary | ICD-10-CM | POA: Diagnosis not present

## 2020-06-12 DIAGNOSIS — I129 Hypertensive chronic kidney disease with stage 1 through stage 4 chronic kidney disease, or unspecified chronic kidney disease: Secondary | ICD-10-CM | POA: Diagnosis not present

## 2020-06-12 DIAGNOSIS — J441 Chronic obstructive pulmonary disease with (acute) exacerbation: Secondary | ICD-10-CM | POA: Diagnosis not present

## 2020-06-13 DIAGNOSIS — I129 Hypertensive chronic kidney disease with stage 1 through stage 4 chronic kidney disease, or unspecified chronic kidney disease: Secondary | ICD-10-CM | POA: Diagnosis not present

## 2020-06-13 DIAGNOSIS — J441 Chronic obstructive pulmonary disease with (acute) exacerbation: Secondary | ICD-10-CM | POA: Diagnosis not present

## 2020-06-13 DIAGNOSIS — N184 Chronic kidney disease, stage 4 (severe): Secondary | ICD-10-CM | POA: Diagnosis not present

## 2020-06-13 DIAGNOSIS — D631 Anemia in chronic kidney disease: Secondary | ICD-10-CM | POA: Diagnosis not present

## 2020-06-13 DIAGNOSIS — I69318 Other symptoms and signs involving cognitive functions following cerebral infarction: Secondary | ICD-10-CM | POA: Diagnosis not present

## 2020-06-13 DIAGNOSIS — E1122 Type 2 diabetes mellitus with diabetic chronic kidney disease: Secondary | ICD-10-CM | POA: Diagnosis not present

## 2020-06-16 DIAGNOSIS — I129 Hypertensive chronic kidney disease with stage 1 through stage 4 chronic kidney disease, or unspecified chronic kidney disease: Secondary | ICD-10-CM | POA: Diagnosis not present

## 2020-06-16 DIAGNOSIS — E1122 Type 2 diabetes mellitus with diabetic chronic kidney disease: Secondary | ICD-10-CM | POA: Diagnosis not present

## 2020-06-16 DIAGNOSIS — J441 Chronic obstructive pulmonary disease with (acute) exacerbation: Secondary | ICD-10-CM | POA: Diagnosis not present

## 2020-06-16 DIAGNOSIS — I69318 Other symptoms and signs involving cognitive functions following cerebral infarction: Secondary | ICD-10-CM | POA: Diagnosis not present

## 2020-06-16 DIAGNOSIS — N184 Chronic kidney disease, stage 4 (severe): Secondary | ICD-10-CM | POA: Diagnosis not present

## 2020-06-16 DIAGNOSIS — D631 Anemia in chronic kidney disease: Secondary | ICD-10-CM | POA: Diagnosis not present

## 2020-06-18 DIAGNOSIS — N184 Chronic kidney disease, stage 4 (severe): Secondary | ICD-10-CM | POA: Diagnosis not present

## 2020-06-18 DIAGNOSIS — J441 Chronic obstructive pulmonary disease with (acute) exacerbation: Secondary | ICD-10-CM | POA: Diagnosis not present

## 2020-06-18 DIAGNOSIS — I69318 Other symptoms and signs involving cognitive functions following cerebral infarction: Secondary | ICD-10-CM | POA: Diagnosis not present

## 2020-06-18 DIAGNOSIS — I129 Hypertensive chronic kidney disease with stage 1 through stage 4 chronic kidney disease, or unspecified chronic kidney disease: Secondary | ICD-10-CM | POA: Diagnosis not present

## 2020-06-18 DIAGNOSIS — D631 Anemia in chronic kidney disease: Secondary | ICD-10-CM | POA: Diagnosis not present

## 2020-06-18 DIAGNOSIS — E1122 Type 2 diabetes mellitus with diabetic chronic kidney disease: Secondary | ICD-10-CM | POA: Diagnosis not present

## 2020-06-19 DIAGNOSIS — E1122 Type 2 diabetes mellitus with diabetic chronic kidney disease: Secondary | ICD-10-CM | POA: Diagnosis not present

## 2020-06-19 DIAGNOSIS — I69318 Other symptoms and signs involving cognitive functions following cerebral infarction: Secondary | ICD-10-CM | POA: Diagnosis not present

## 2020-06-19 DIAGNOSIS — I129 Hypertensive chronic kidney disease with stage 1 through stage 4 chronic kidney disease, or unspecified chronic kidney disease: Secondary | ICD-10-CM | POA: Diagnosis not present

## 2020-06-19 DIAGNOSIS — D631 Anemia in chronic kidney disease: Secondary | ICD-10-CM | POA: Diagnosis not present

## 2020-06-19 DIAGNOSIS — J441 Chronic obstructive pulmonary disease with (acute) exacerbation: Secondary | ICD-10-CM | POA: Diagnosis not present

## 2020-06-19 DIAGNOSIS — N184 Chronic kidney disease, stage 4 (severe): Secondary | ICD-10-CM | POA: Diagnosis not present

## 2020-06-20 DIAGNOSIS — I129 Hypertensive chronic kidney disease with stage 1 through stage 4 chronic kidney disease, or unspecified chronic kidney disease: Secondary | ICD-10-CM | POA: Diagnosis not present

## 2020-06-20 DIAGNOSIS — J441 Chronic obstructive pulmonary disease with (acute) exacerbation: Secondary | ICD-10-CM | POA: Diagnosis not present

## 2020-06-20 DIAGNOSIS — N184 Chronic kidney disease, stage 4 (severe): Secondary | ICD-10-CM | POA: Diagnosis not present

## 2020-06-20 DIAGNOSIS — E1122 Type 2 diabetes mellitus with diabetic chronic kidney disease: Secondary | ICD-10-CM | POA: Diagnosis not present

## 2020-06-20 DIAGNOSIS — D631 Anemia in chronic kidney disease: Secondary | ICD-10-CM | POA: Diagnosis not present

## 2020-06-20 DIAGNOSIS — I69318 Other symptoms and signs involving cognitive functions following cerebral infarction: Secondary | ICD-10-CM | POA: Diagnosis not present

## 2020-06-21 DIAGNOSIS — F331 Major depressive disorder, recurrent, moderate: Secondary | ICD-10-CM | POA: Diagnosis not present

## 2020-06-24 DIAGNOSIS — I69318 Other symptoms and signs involving cognitive functions following cerebral infarction: Secondary | ICD-10-CM | POA: Diagnosis not present

## 2020-06-24 DIAGNOSIS — D631 Anemia in chronic kidney disease: Secondary | ICD-10-CM | POA: Diagnosis not present

## 2020-06-24 DIAGNOSIS — E1122 Type 2 diabetes mellitus with diabetic chronic kidney disease: Secondary | ICD-10-CM | POA: Diagnosis not present

## 2020-06-24 DIAGNOSIS — J441 Chronic obstructive pulmonary disease with (acute) exacerbation: Secondary | ICD-10-CM | POA: Diagnosis not present

## 2020-06-24 DIAGNOSIS — N184 Chronic kidney disease, stage 4 (severe): Secondary | ICD-10-CM | POA: Diagnosis not present

## 2020-06-24 DIAGNOSIS — I129 Hypertensive chronic kidney disease with stage 1 through stage 4 chronic kidney disease, or unspecified chronic kidney disease: Secondary | ICD-10-CM | POA: Diagnosis not present

## 2020-06-25 DIAGNOSIS — E1122 Type 2 diabetes mellitus with diabetic chronic kidney disease: Secondary | ICD-10-CM | POA: Diagnosis not present

## 2020-06-25 DIAGNOSIS — J441 Chronic obstructive pulmonary disease with (acute) exacerbation: Secondary | ICD-10-CM | POA: Diagnosis not present

## 2020-06-25 DIAGNOSIS — D631 Anemia in chronic kidney disease: Secondary | ICD-10-CM | POA: Diagnosis not present

## 2020-06-25 DIAGNOSIS — I129 Hypertensive chronic kidney disease with stage 1 through stage 4 chronic kidney disease, or unspecified chronic kidney disease: Secondary | ICD-10-CM | POA: Diagnosis not present

## 2020-06-25 DIAGNOSIS — N184 Chronic kidney disease, stage 4 (severe): Secondary | ICD-10-CM | POA: Diagnosis not present

## 2020-06-25 DIAGNOSIS — I69318 Other symptoms and signs involving cognitive functions following cerebral infarction: Secondary | ICD-10-CM | POA: Diagnosis not present

## 2020-06-26 DIAGNOSIS — N184 Chronic kidney disease, stage 4 (severe): Secondary | ICD-10-CM | POA: Diagnosis not present

## 2020-06-26 DIAGNOSIS — E1122 Type 2 diabetes mellitus with diabetic chronic kidney disease: Secondary | ICD-10-CM | POA: Diagnosis not present

## 2020-06-26 DIAGNOSIS — J441 Chronic obstructive pulmonary disease with (acute) exacerbation: Secondary | ICD-10-CM | POA: Diagnosis not present

## 2020-06-26 DIAGNOSIS — I69318 Other symptoms and signs involving cognitive functions following cerebral infarction: Secondary | ICD-10-CM | POA: Diagnosis not present

## 2020-06-26 DIAGNOSIS — D631 Anemia in chronic kidney disease: Secondary | ICD-10-CM | POA: Diagnosis not present

## 2020-06-26 DIAGNOSIS — I129 Hypertensive chronic kidney disease with stage 1 through stage 4 chronic kidney disease, or unspecified chronic kidney disease: Secondary | ICD-10-CM | POA: Diagnosis not present

## 2020-06-27 DIAGNOSIS — D631 Anemia in chronic kidney disease: Secondary | ICD-10-CM | POA: Diagnosis not present

## 2020-06-27 DIAGNOSIS — N184 Chronic kidney disease, stage 4 (severe): Secondary | ICD-10-CM | POA: Diagnosis not present

## 2020-06-27 DIAGNOSIS — I69318 Other symptoms and signs involving cognitive functions following cerebral infarction: Secondary | ICD-10-CM | POA: Diagnosis not present

## 2020-06-27 DIAGNOSIS — J441 Chronic obstructive pulmonary disease with (acute) exacerbation: Secondary | ICD-10-CM | POA: Diagnosis not present

## 2020-06-27 DIAGNOSIS — E1122 Type 2 diabetes mellitus with diabetic chronic kidney disease: Secondary | ICD-10-CM | POA: Diagnosis not present

## 2020-06-27 DIAGNOSIS — I129 Hypertensive chronic kidney disease with stage 1 through stage 4 chronic kidney disease, or unspecified chronic kidney disease: Secondary | ICD-10-CM | POA: Diagnosis not present

## 2020-06-30 DIAGNOSIS — E1122 Type 2 diabetes mellitus with diabetic chronic kidney disease: Secondary | ICD-10-CM | POA: Diagnosis not present

## 2020-06-30 DIAGNOSIS — D631 Anemia in chronic kidney disease: Secondary | ICD-10-CM | POA: Diagnosis not present

## 2020-06-30 DIAGNOSIS — J441 Chronic obstructive pulmonary disease with (acute) exacerbation: Secondary | ICD-10-CM | POA: Diagnosis not present

## 2020-06-30 DIAGNOSIS — N184 Chronic kidney disease, stage 4 (severe): Secondary | ICD-10-CM | POA: Diagnosis not present

## 2020-06-30 DIAGNOSIS — I69318 Other symptoms and signs involving cognitive functions following cerebral infarction: Secondary | ICD-10-CM | POA: Diagnosis not present

## 2020-06-30 DIAGNOSIS — I129 Hypertensive chronic kidney disease with stage 1 through stage 4 chronic kidney disease, or unspecified chronic kidney disease: Secondary | ICD-10-CM | POA: Diagnosis not present

## 2020-07-01 DIAGNOSIS — E1122 Type 2 diabetes mellitus with diabetic chronic kidney disease: Secondary | ICD-10-CM | POA: Diagnosis not present

## 2020-07-01 DIAGNOSIS — I129 Hypertensive chronic kidney disease with stage 1 through stage 4 chronic kidney disease, or unspecified chronic kidney disease: Secondary | ICD-10-CM | POA: Diagnosis not present

## 2020-07-01 DIAGNOSIS — D631 Anemia in chronic kidney disease: Secondary | ICD-10-CM | POA: Diagnosis not present

## 2020-07-01 DIAGNOSIS — N184 Chronic kidney disease, stage 4 (severe): Secondary | ICD-10-CM | POA: Diagnosis not present

## 2020-07-01 DIAGNOSIS — I69318 Other symptoms and signs involving cognitive functions following cerebral infarction: Secondary | ICD-10-CM | POA: Diagnosis not present

## 2020-07-01 DIAGNOSIS — J441 Chronic obstructive pulmonary disease with (acute) exacerbation: Secondary | ICD-10-CM | POA: Diagnosis not present

## 2020-07-03 DIAGNOSIS — J441 Chronic obstructive pulmonary disease with (acute) exacerbation: Secondary | ICD-10-CM | POA: Diagnosis not present

## 2020-07-03 DIAGNOSIS — N1832 Chronic kidney disease, stage 3b: Secondary | ICD-10-CM | POA: Diagnosis not present

## 2020-07-03 DIAGNOSIS — D631 Anemia in chronic kidney disease: Secondary | ICD-10-CM | POA: Diagnosis not present

## 2020-07-03 DIAGNOSIS — E1122 Type 2 diabetes mellitus with diabetic chronic kidney disease: Secondary | ICD-10-CM | POA: Diagnosis not present

## 2020-07-03 DIAGNOSIS — I10 Essential (primary) hypertension: Secondary | ICD-10-CM | POA: Diagnosis not present

## 2020-07-03 DIAGNOSIS — K2100 Gastro-esophageal reflux disease with esophagitis, without bleeding: Secondary | ICD-10-CM | POA: Diagnosis not present

## 2020-07-03 DIAGNOSIS — J9601 Acute respiratory failure with hypoxia: Secondary | ICD-10-CM | POA: Diagnosis not present

## 2020-07-03 DIAGNOSIS — N184 Chronic kidney disease, stage 4 (severe): Secondary | ICD-10-CM | POA: Diagnosis not present

## 2020-07-03 DIAGNOSIS — I69318 Other symptoms and signs involving cognitive functions following cerebral infarction: Secondary | ICD-10-CM | POA: Diagnosis not present

## 2020-07-03 DIAGNOSIS — I129 Hypertensive chronic kidney disease with stage 1 through stage 4 chronic kidney disease, or unspecified chronic kidney disease: Secondary | ICD-10-CM | POA: Diagnosis not present

## 2020-07-03 DIAGNOSIS — J449 Chronic obstructive pulmonary disease, unspecified: Secondary | ICD-10-CM | POA: Diagnosis not present

## 2020-07-03 DIAGNOSIS — J9611 Chronic respiratory failure with hypoxia: Secondary | ICD-10-CM | POA: Diagnosis not present

## 2020-07-03 DIAGNOSIS — Z6829 Body mass index (BMI) 29.0-29.9, adult: Secondary | ICD-10-CM | POA: Diagnosis not present

## 2020-07-04 DIAGNOSIS — I69318 Other symptoms and signs involving cognitive functions following cerebral infarction: Secondary | ICD-10-CM | POA: Diagnosis not present

## 2020-07-04 DIAGNOSIS — J441 Chronic obstructive pulmonary disease with (acute) exacerbation: Secondary | ICD-10-CM | POA: Diagnosis not present

## 2020-07-04 DIAGNOSIS — D631 Anemia in chronic kidney disease: Secondary | ICD-10-CM | POA: Diagnosis not present

## 2020-07-04 DIAGNOSIS — E1122 Type 2 diabetes mellitus with diabetic chronic kidney disease: Secondary | ICD-10-CM | POA: Diagnosis not present

## 2020-07-04 DIAGNOSIS — N184 Chronic kidney disease, stage 4 (severe): Secondary | ICD-10-CM | POA: Diagnosis not present

## 2020-07-04 DIAGNOSIS — I129 Hypertensive chronic kidney disease with stage 1 through stage 4 chronic kidney disease, or unspecified chronic kidney disease: Secondary | ICD-10-CM | POA: Diagnosis not present

## 2020-07-06 DIAGNOSIS — F17210 Nicotine dependence, cigarettes, uncomplicated: Secondary | ICD-10-CM | POA: Diagnosis not present

## 2020-07-06 DIAGNOSIS — J029 Acute pharyngitis, unspecified: Secondary | ICD-10-CM | POA: Diagnosis not present

## 2020-07-08 DIAGNOSIS — D631 Anemia in chronic kidney disease: Secondary | ICD-10-CM | POA: Diagnosis not present

## 2020-07-08 DIAGNOSIS — N184 Chronic kidney disease, stage 4 (severe): Secondary | ICD-10-CM | POA: Diagnosis not present

## 2020-07-08 DIAGNOSIS — I129 Hypertensive chronic kidney disease with stage 1 through stage 4 chronic kidney disease, or unspecified chronic kidney disease: Secondary | ICD-10-CM | POA: Diagnosis not present

## 2020-07-08 DIAGNOSIS — E1122 Type 2 diabetes mellitus with diabetic chronic kidney disease: Secondary | ICD-10-CM | POA: Diagnosis not present

## 2020-07-08 DIAGNOSIS — I69318 Other symptoms and signs involving cognitive functions following cerebral infarction: Secondary | ICD-10-CM | POA: Diagnosis not present

## 2020-07-08 DIAGNOSIS — J441 Chronic obstructive pulmonary disease with (acute) exacerbation: Secondary | ICD-10-CM | POA: Diagnosis not present

## 2020-07-09 DIAGNOSIS — N184 Chronic kidney disease, stage 4 (severe): Secondary | ICD-10-CM | POA: Diagnosis not present

## 2020-07-09 DIAGNOSIS — J441 Chronic obstructive pulmonary disease with (acute) exacerbation: Secondary | ICD-10-CM | POA: Diagnosis not present

## 2020-07-09 DIAGNOSIS — I129 Hypertensive chronic kidney disease with stage 1 through stage 4 chronic kidney disease, or unspecified chronic kidney disease: Secondary | ICD-10-CM | POA: Diagnosis not present

## 2020-07-09 DIAGNOSIS — I69318 Other symptoms and signs involving cognitive functions following cerebral infarction: Secondary | ICD-10-CM | POA: Diagnosis not present

## 2020-07-09 DIAGNOSIS — D631 Anemia in chronic kidney disease: Secondary | ICD-10-CM | POA: Diagnosis not present

## 2020-07-09 DIAGNOSIS — E1122 Type 2 diabetes mellitus with diabetic chronic kidney disease: Secondary | ICD-10-CM | POA: Diagnosis not present

## 2020-07-14 DIAGNOSIS — D631 Anemia in chronic kidney disease: Secondary | ICD-10-CM | POA: Diagnosis not present

## 2020-07-14 DIAGNOSIS — N184 Chronic kidney disease, stage 4 (severe): Secondary | ICD-10-CM | POA: Diagnosis not present

## 2020-07-14 DIAGNOSIS — E1122 Type 2 diabetes mellitus with diabetic chronic kidney disease: Secondary | ICD-10-CM | POA: Diagnosis not present

## 2020-07-14 DIAGNOSIS — I69318 Other symptoms and signs involving cognitive functions following cerebral infarction: Secondary | ICD-10-CM | POA: Diagnosis not present

## 2020-07-14 DIAGNOSIS — I129 Hypertensive chronic kidney disease with stage 1 through stage 4 chronic kidney disease, or unspecified chronic kidney disease: Secondary | ICD-10-CM | POA: Diagnosis not present

## 2020-07-14 DIAGNOSIS — J441 Chronic obstructive pulmonary disease with (acute) exacerbation: Secondary | ICD-10-CM | POA: Diagnosis not present

## 2020-07-15 DIAGNOSIS — J441 Chronic obstructive pulmonary disease with (acute) exacerbation: Secondary | ICD-10-CM | POA: Diagnosis not present

## 2020-07-15 DIAGNOSIS — D631 Anemia in chronic kidney disease: Secondary | ICD-10-CM | POA: Diagnosis not present

## 2020-07-15 DIAGNOSIS — I69318 Other symptoms and signs involving cognitive functions following cerebral infarction: Secondary | ICD-10-CM | POA: Diagnosis not present

## 2020-07-15 DIAGNOSIS — I129 Hypertensive chronic kidney disease with stage 1 through stage 4 chronic kidney disease, or unspecified chronic kidney disease: Secondary | ICD-10-CM | POA: Diagnosis not present

## 2020-07-15 DIAGNOSIS — E1122 Type 2 diabetes mellitus with diabetic chronic kidney disease: Secondary | ICD-10-CM | POA: Diagnosis not present

## 2020-07-15 DIAGNOSIS — N184 Chronic kidney disease, stage 4 (severe): Secondary | ICD-10-CM | POA: Diagnosis not present

## 2020-07-17 ENCOUNTER — Other Ambulatory Visit: Payer: Self-pay | Admitting: Family Medicine

## 2020-07-17 ENCOUNTER — Other Ambulatory Visit (HOSPITAL_COMMUNITY): Payer: Self-pay | Admitting: Family Medicine

## 2020-07-17 DIAGNOSIS — R911 Solitary pulmonary nodule: Secondary | ICD-10-CM

## 2020-07-21 DIAGNOSIS — N184 Chronic kidney disease, stage 4 (severe): Secondary | ICD-10-CM | POA: Diagnosis not present

## 2020-07-21 DIAGNOSIS — J441 Chronic obstructive pulmonary disease with (acute) exacerbation: Secondary | ICD-10-CM | POA: Diagnosis not present

## 2020-07-21 DIAGNOSIS — I69318 Other symptoms and signs involving cognitive functions following cerebral infarction: Secondary | ICD-10-CM | POA: Diagnosis not present

## 2020-07-21 DIAGNOSIS — D631 Anemia in chronic kidney disease: Secondary | ICD-10-CM | POA: Diagnosis not present

## 2020-07-21 DIAGNOSIS — E1122 Type 2 diabetes mellitus with diabetic chronic kidney disease: Secondary | ICD-10-CM | POA: Diagnosis not present

## 2020-07-21 DIAGNOSIS — I129 Hypertensive chronic kidney disease with stage 1 through stage 4 chronic kidney disease, or unspecified chronic kidney disease: Secondary | ICD-10-CM | POA: Diagnosis not present

## 2020-07-28 DIAGNOSIS — E782 Mixed hyperlipidemia: Secondary | ICD-10-CM | POA: Diagnosis not present

## 2020-07-28 DIAGNOSIS — E1122 Type 2 diabetes mellitus with diabetic chronic kidney disease: Secondary | ICD-10-CM | POA: Diagnosis not present

## 2020-07-28 DIAGNOSIS — E039 Hypothyroidism, unspecified: Secondary | ICD-10-CM | POA: Diagnosis not present

## 2020-07-28 DIAGNOSIS — E7849 Other hyperlipidemia: Secondary | ICD-10-CM | POA: Diagnosis not present

## 2020-07-28 DIAGNOSIS — K2100 Gastro-esophageal reflux disease with esophagitis, without bleeding: Secondary | ICD-10-CM | POA: Diagnosis not present

## 2020-07-28 DIAGNOSIS — N184 Chronic kidney disease, stage 4 (severe): Secondary | ICD-10-CM | POA: Diagnosis not present

## 2020-07-31 DIAGNOSIS — F419 Anxiety disorder, unspecified: Secondary | ICD-10-CM | POA: Diagnosis not present

## 2020-07-31 DIAGNOSIS — H353290 Exudative age-related macular degeneration, unspecified eye, stage unspecified: Secondary | ICD-10-CM | POA: Diagnosis not present

## 2020-07-31 DIAGNOSIS — J449 Chronic obstructive pulmonary disease, unspecified: Secondary | ICD-10-CM | POA: Diagnosis not present

## 2020-07-31 DIAGNOSIS — Z515 Encounter for palliative care: Secondary | ICD-10-CM | POA: Diagnosis not present

## 2020-07-31 DIAGNOSIS — G8194 Hemiplegia, unspecified affecting left nondominant side: Secondary | ICD-10-CM | POA: Diagnosis not present

## 2020-07-31 DIAGNOSIS — F330 Major depressive disorder, recurrent, mild: Secondary | ICD-10-CM | POA: Diagnosis not present

## 2020-07-31 DIAGNOSIS — F17200 Nicotine dependence, unspecified, uncomplicated: Secondary | ICD-10-CM | POA: Diagnosis not present

## 2020-07-31 DIAGNOSIS — D692 Other nonthrombocytopenic purpura: Secondary | ICD-10-CM | POA: Diagnosis not present

## 2020-07-31 DIAGNOSIS — K589 Irritable bowel syndrome without diarrhea: Secondary | ICD-10-CM | POA: Diagnosis not present

## 2020-08-01 DIAGNOSIS — E7849 Other hyperlipidemia: Secondary | ICD-10-CM | POA: Diagnosis not present

## 2020-08-01 DIAGNOSIS — G8194 Hemiplegia, unspecified affecting left nondominant side: Secondary | ICD-10-CM | POA: Diagnosis not present

## 2020-08-01 DIAGNOSIS — E1122 Type 2 diabetes mellitus with diabetic chronic kidney disease: Secondary | ICD-10-CM | POA: Diagnosis not present

## 2020-08-01 DIAGNOSIS — J9611 Chronic respiratory failure with hypoxia: Secondary | ICD-10-CM | POA: Diagnosis not present

## 2020-08-01 DIAGNOSIS — Z23 Encounter for immunization: Secondary | ICD-10-CM | POA: Diagnosis not present

## 2020-08-01 DIAGNOSIS — D692 Other nonthrombocytopenic purpura: Secondary | ICD-10-CM | POA: Diagnosis not present

## 2020-08-01 DIAGNOSIS — E1142 Type 2 diabetes mellitus with diabetic polyneuropathy: Secondary | ICD-10-CM | POA: Diagnosis not present

## 2020-08-01 DIAGNOSIS — I10 Essential (primary) hypertension: Secondary | ICD-10-CM | POA: Diagnosis not present

## 2020-08-03 DIAGNOSIS — I10 Essential (primary) hypertension: Secondary | ICD-10-CM | POA: Diagnosis not present

## 2020-08-03 DIAGNOSIS — J441 Chronic obstructive pulmonary disease with (acute) exacerbation: Secondary | ICD-10-CM | POA: Diagnosis not present

## 2020-08-03 DIAGNOSIS — J9601 Acute respiratory failure with hypoxia: Secondary | ICD-10-CM | POA: Diagnosis not present

## 2020-08-06 ENCOUNTER — Other Ambulatory Visit: Payer: Self-pay

## 2020-08-06 ENCOUNTER — Ambulatory Visit (HOSPITAL_COMMUNITY)
Admission: RE | Admit: 2020-08-06 | Discharge: 2020-08-06 | Disposition: A | Discharge status: Home / Self Care | Payer: Medicare HMO | Source: Ambulatory Visit | Attending: Family Medicine | Admitting: Family Medicine

## 2020-08-06 DIAGNOSIS — R911 Solitary pulmonary nodule: Secondary | ICD-10-CM | POA: Diagnosis not present

## 2020-08-06 LAB — GLUCOSE, CAPILLARY: Glucose-Capillary: 152 mg/dL — ABNORMAL HIGH (ref 70–99)

## 2020-08-06 MED ORDER — FLUDEOXYGLUCOSE F - 18 (FDG) INJECTION
11.0000 | Freq: Once | INTRAVENOUS | Status: AC
  Administered 2020-08-06: 9.2 via INTRAVENOUS

## 2020-08-15 DIAGNOSIS — F17210 Nicotine dependence, cigarettes, uncomplicated: Secondary | ICD-10-CM | POA: Diagnosis not present

## 2020-08-15 DIAGNOSIS — J441 Chronic obstructive pulmonary disease with (acute) exacerbation: Secondary | ICD-10-CM | POA: Diagnosis not present

## 2020-08-28 DIAGNOSIS — R918 Other nonspecific abnormal finding of lung field: Secondary | ICD-10-CM | POA: Diagnosis not present

## 2020-08-28 DIAGNOSIS — J432 Centrilobular emphysema: Secondary | ICD-10-CM | POA: Diagnosis not present

## 2020-08-28 DIAGNOSIS — J9811 Atelectasis: Secondary | ICD-10-CM | POA: Diagnosis not present

## 2020-08-28 DIAGNOSIS — E782 Mixed hyperlipidemia: Secondary | ICD-10-CM | POA: Diagnosis not present

## 2020-08-28 DIAGNOSIS — I129 Hypertensive chronic kidney disease with stage 1 through stage 4 chronic kidney disease, or unspecified chronic kidney disease: Secondary | ICD-10-CM | POA: Diagnosis not present

## 2020-08-28 DIAGNOSIS — Z87891 Personal history of nicotine dependence: Secondary | ICD-10-CM | POA: Diagnosis not present

## 2020-08-28 DIAGNOSIS — R911 Solitary pulmonary nodule: Secondary | ICD-10-CM | POA: Diagnosis not present

## 2020-08-28 DIAGNOSIS — N184 Chronic kidney disease, stage 4 (severe): Secondary | ICD-10-CM | POA: Diagnosis not present

## 2020-08-28 DIAGNOSIS — I700 Atherosclerosis of aorta: Secondary | ICD-10-CM | POA: Diagnosis not present

## 2020-08-28 DIAGNOSIS — R0602 Shortness of breath: Secondary | ICD-10-CM | POA: Diagnosis not present

## 2020-08-28 DIAGNOSIS — E1122 Type 2 diabetes mellitus with diabetic chronic kidney disease: Secondary | ICD-10-CM | POA: Diagnosis not present

## 2020-08-28 DIAGNOSIS — J439 Emphysema, unspecified: Secondary | ICD-10-CM | POA: Diagnosis not present

## 2020-08-28 DIAGNOSIS — J441 Chronic obstructive pulmonary disease with (acute) exacerbation: Secondary | ICD-10-CM | POA: Diagnosis not present

## 2020-08-28 DIAGNOSIS — Z20822 Contact with and (suspected) exposure to covid-19: Secondary | ICD-10-CM | POA: Diagnosis not present

## 2020-09-02 DIAGNOSIS — J441 Chronic obstructive pulmonary disease with (acute) exacerbation: Secondary | ICD-10-CM | POA: Diagnosis not present

## 2020-09-02 DIAGNOSIS — I10 Essential (primary) hypertension: Secondary | ICD-10-CM | POA: Diagnosis not present

## 2020-09-02 DIAGNOSIS — J9601 Acute respiratory failure with hypoxia: Secondary | ICD-10-CM | POA: Diagnosis not present

## 2020-09-16 DIAGNOSIS — J9610 Chronic respiratory failure, unspecified whether with hypoxia or hypercapnia: Secondary | ICD-10-CM | POA: Diagnosis not present

## 2020-09-16 DIAGNOSIS — Z87891 Personal history of nicotine dependence: Secondary | ICD-10-CM | POA: Diagnosis not present

## 2020-09-16 DIAGNOSIS — J449 Chronic obstructive pulmonary disease, unspecified: Secondary | ICD-10-CM | POA: Diagnosis not present

## 2020-09-16 DIAGNOSIS — F330 Major depressive disorder, recurrent, mild: Secondary | ICD-10-CM | POA: Diagnosis not present

## 2020-09-16 DIAGNOSIS — Z9981 Dependence on supplemental oxygen: Secondary | ICD-10-CM | POA: Diagnosis not present

## 2020-09-16 DIAGNOSIS — Z515 Encounter for palliative care: Secondary | ICD-10-CM | POA: Diagnosis not present

## 2020-10-03 DIAGNOSIS — J9601 Acute respiratory failure with hypoxia: Secondary | ICD-10-CM | POA: Diagnosis not present

## 2020-10-03 DIAGNOSIS — J441 Chronic obstructive pulmonary disease with (acute) exacerbation: Secondary | ICD-10-CM | POA: Diagnosis not present

## 2020-10-03 DIAGNOSIS — I10 Essential (primary) hypertension: Secondary | ICD-10-CM | POA: Diagnosis not present

## 2020-10-31 DIAGNOSIS — K2100 Gastro-esophageal reflux disease with esophagitis, without bleeding: Secondary | ICD-10-CM | POA: Diagnosis not present

## 2020-10-31 DIAGNOSIS — E782 Mixed hyperlipidemia: Secondary | ICD-10-CM | POA: Diagnosis not present

## 2020-10-31 DIAGNOSIS — E039 Hypothyroidism, unspecified: Secondary | ICD-10-CM | POA: Diagnosis not present

## 2020-10-31 DIAGNOSIS — N184 Chronic kidney disease, stage 4 (severe): Secondary | ICD-10-CM | POA: Diagnosis not present

## 2020-10-31 DIAGNOSIS — E1122 Type 2 diabetes mellitus with diabetic chronic kidney disease: Secondary | ICD-10-CM | POA: Diagnosis not present

## 2020-10-31 DIAGNOSIS — I10 Essential (primary) hypertension: Secondary | ICD-10-CM | POA: Diagnosis not present

## 2020-10-31 DIAGNOSIS — E7849 Other hyperlipidemia: Secondary | ICD-10-CM | POA: Diagnosis not present

## 2020-11-03 DIAGNOSIS — J441 Chronic obstructive pulmonary disease with (acute) exacerbation: Secondary | ICD-10-CM | POA: Diagnosis not present

## 2020-11-03 DIAGNOSIS — J9601 Acute respiratory failure with hypoxia: Secondary | ICD-10-CM | POA: Diagnosis not present

## 2020-11-03 DIAGNOSIS — I10 Essential (primary) hypertension: Secondary | ICD-10-CM | POA: Diagnosis not present

## 2020-11-06 DIAGNOSIS — E1122 Type 2 diabetes mellitus with diabetic chronic kidney disease: Secondary | ICD-10-CM | POA: Diagnosis not present

## 2020-11-06 DIAGNOSIS — E1142 Type 2 diabetes mellitus with diabetic polyneuropathy: Secondary | ICD-10-CM | POA: Diagnosis not present

## 2020-11-06 DIAGNOSIS — Z23 Encounter for immunization: Secondary | ICD-10-CM | POA: Diagnosis not present

## 2020-11-06 DIAGNOSIS — F331 Major depressive disorder, recurrent, moderate: Secondary | ICD-10-CM | POA: Diagnosis not present

## 2020-11-06 DIAGNOSIS — E7849 Other hyperlipidemia: Secondary | ICD-10-CM | POA: Diagnosis not present

## 2020-11-06 DIAGNOSIS — G43909 Migraine, unspecified, not intractable, without status migrainosus: Secondary | ICD-10-CM | POA: Diagnosis not present

## 2020-11-06 DIAGNOSIS — F17210 Nicotine dependence, cigarettes, uncomplicated: Secondary | ICD-10-CM | POA: Diagnosis not present

## 2020-11-06 DIAGNOSIS — I10 Essential (primary) hypertension: Secondary | ICD-10-CM | POA: Diagnosis not present

## 2020-11-11 DIAGNOSIS — I10 Essential (primary) hypertension: Secondary | ICD-10-CM | POA: Diagnosis not present

## 2020-11-11 DIAGNOSIS — E1122 Type 2 diabetes mellitus with diabetic chronic kidney disease: Secondary | ICD-10-CM | POA: Diagnosis not present

## 2020-11-18 DIAGNOSIS — J441 Chronic obstructive pulmonary disease with (acute) exacerbation: Secondary | ICD-10-CM | POA: Diagnosis not present

## 2020-11-18 DIAGNOSIS — I10 Essential (primary) hypertension: Secondary | ICD-10-CM | POA: Diagnosis not present

## 2020-12-03 DIAGNOSIS — I10 Essential (primary) hypertension: Secondary | ICD-10-CM | POA: Diagnosis not present

## 2020-12-03 DIAGNOSIS — J441 Chronic obstructive pulmonary disease with (acute) exacerbation: Secondary | ICD-10-CM | POA: Diagnosis not present

## 2020-12-03 DIAGNOSIS — J9601 Acute respiratory failure with hypoxia: Secondary | ICD-10-CM | POA: Diagnosis not present

## 2020-12-22 DIAGNOSIS — L84 Corns and callosities: Secondary | ICD-10-CM | POA: Diagnosis not present

## 2020-12-22 DIAGNOSIS — J441 Chronic obstructive pulmonary disease with (acute) exacerbation: Secondary | ICD-10-CM | POA: Diagnosis not present
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
## 2021-03-17 ENCOUNTER — Encounter (INDEPENDENT_AMBULATORY_CARE_PROVIDER_SITE_OTHER): Payer: Self-pay

## 2021-03-17 ENCOUNTER — Encounter (INDEPENDENT_AMBULATORY_CARE_PROVIDER_SITE_OTHER): Payer: Medicare PPO | Admitting: Ophthalmology

## 2021-03-17 DIAGNOSIS — H35033 Hypertensive retinopathy, bilateral: Secondary | ICD-10-CM

## 2021-03-17 DIAGNOSIS — H353231 Exudative age-related macular degeneration, bilateral, with active choroidal neovascularization: Secondary | ICD-10-CM

## 2021-03-17 DIAGNOSIS — E113393 Type 2 diabetes mellitus with moderate nonproliferative diabetic retinopathy without macular edema, bilateral: Secondary | ICD-10-CM

## 2021-03-17 DIAGNOSIS — H04123 Dry eye syndrome of bilateral lacrimal glands: Secondary | ICD-10-CM

## 2021-03-17 DIAGNOSIS — I10 Essential (primary) hypertension: Secondary | ICD-10-CM

## 2021-03-17 DIAGNOSIS — H25811 Combined forms of age-related cataract, right eye: Secondary | ICD-10-CM

## 2021-03-17 DIAGNOSIS — Z961 Presence of intraocular lens: Secondary | ICD-10-CM
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
## 2021-03-25 DIAGNOSIS — R918 Other nonspecific abnormal finding of lung field: Secondary | ICD-10-CM | POA: Diagnosis not present

## 2021-03-25 DIAGNOSIS — I700 Atherosclerosis of aorta: Secondary | ICD-10-CM | POA: Diagnosis not present

## 2021-03-25 DIAGNOSIS — J439 Emphysema, unspecified: Secondary | ICD-10-CM | POA: Diagnosis not present

## 2021-03-25 DIAGNOSIS — Z122 Encounter for screening for malignant neoplasm of respiratory organs: Secondary | ICD-10-CM | POA: Diagnosis not present

## 2021-03-25 DIAGNOSIS — Z87891 Personal history of nicotine dependence: Secondary | ICD-10-CM | POA: Diagnosis not present

## 2021-03-27 ENCOUNTER — Encounter (INDEPENDENT_AMBULATORY_CARE_PROVIDER_SITE_OTHER): Payer: Self-pay | Admitting: Ophthalmology

## 2021-03-27 ENCOUNTER — Other Ambulatory Visit: Payer: Self-pay

## 2021-03-27 ENCOUNTER — Ambulatory Visit (INDEPENDENT_AMBULATORY_CARE_PROVIDER_SITE_OTHER): Payer: Medicare PPO | Admitting: Ophthalmology

## 2021-03-27 DIAGNOSIS — Z961 Presence of intraocular lens: Secondary | ICD-10-CM | POA: Diagnosis not present

## 2021-03-27 DIAGNOSIS — I10 Essential (primary) hypertension: Secondary | ICD-10-CM

## 2021-03-27 DIAGNOSIS — H353231 Exudative age-related macular degeneration, bilateral, with active choroidal neovascularization: Secondary | ICD-10-CM | POA: Diagnosis not present

## 2021-03-27 DIAGNOSIS — H04123 Dry eye syndrome of bilateral lacrimal glands: Secondary | ICD-10-CM

## 2021-03-27 DIAGNOSIS — E113393 Type 2 diabetes mellitus with moderate nonproliferative diabetic retinopathy without macular edema, bilateral: Secondary | ICD-10-CM | POA: Diagnosis not present

## 2021-03-27 DIAGNOSIS — H35033 Hypertensive retinopathy, bilateral: Secondary | ICD-10-CM

## 2021-03-27 MED ORDER — AFLIBERCEPT 2MG/0.05ML IZ SOLN FOR KALEIDOSCOPE
2.0000 mg | INTRAVITREAL | Status: AC | PRN
  Administered 2021-03-27: 2 mg via INTRAVITREAL

## 2021-05-08 ENCOUNTER — Encounter (INDEPENDENT_AMBULATORY_CARE_PROVIDER_SITE_OTHER): Payer: Medicare HMO | Admitting: Ophthalmology

## 2021-05-11 DIAGNOSIS — E7849 Other hyperlipidemia: Secondary | ICD-10-CM | POA: Diagnosis not present

## 2021-05-11 DIAGNOSIS — E1122 Type 2 diabetes mellitus with diabetic chronic kidney disease: Secondary | ICD-10-CM | POA: Diagnosis not present

## 2021-05-11 DIAGNOSIS — D529 Folate deficiency anemia, unspecified: Secondary | ICD-10-CM | POA: Diagnosis not present

## 2021-05-11 DIAGNOSIS — E782 Mixed hyperlipidemia: Secondary | ICD-10-CM | POA: Diagnosis not present

## 2021-05-11 DIAGNOSIS — D649 Anemia, unspecified: Secondary | ICD-10-CM | POA: Diagnosis not present

## 2021-05-11 DIAGNOSIS — D519 Vitamin B12 deficiency anemia, unspecified: Secondary | ICD-10-CM | POA: Diagnosis not present

## 2021-05-11 DIAGNOSIS — I10 Essential (primary) hypertension: Secondary | ICD-10-CM | POA: Diagnosis not present
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
## 2021-05-12 ENCOUNTER — Encounter (INDEPENDENT_AMBULATORY_CARE_PROVIDER_SITE_OTHER): Payer: Self-pay | Admitting: Ophthalmology

## 2021-05-12 ENCOUNTER — Ambulatory Visit (INDEPENDENT_AMBULATORY_CARE_PROVIDER_SITE_OTHER): Payer: Medicare HMO | Admitting: Ophthalmology

## 2021-05-12 DIAGNOSIS — H35033 Hypertensive retinopathy, bilateral: Secondary | ICD-10-CM

## 2021-05-12 DIAGNOSIS — H04123 Dry eye syndrome of bilateral lacrimal glands: Secondary | ICD-10-CM | POA: Diagnosis not present

## 2021-05-12 DIAGNOSIS — Z961 Presence of intraocular lens: Secondary | ICD-10-CM | POA: Diagnosis not present

## 2021-05-12 DIAGNOSIS — I10 Essential (primary) hypertension: Secondary | ICD-10-CM

## 2021-05-12 DIAGNOSIS — E113393 Type 2 diabetes mellitus with moderate nonproliferative diabetic retinopathy without macular edema, bilateral: Secondary | ICD-10-CM | POA: Diagnosis not present

## 2021-05-12 DIAGNOSIS — H353231 Exudative age-related macular degeneration, bilateral, with active choroidal neovascularization: Secondary | ICD-10-CM

## 2021-05-12 DIAGNOSIS — E1122 Type 2 diabetes mellitus with diabetic chronic kidney disease: Secondary | ICD-10-CM | POA: Diagnosis not present

## 2021-05-14 ENCOUNTER — Encounter (INDEPENDENT_AMBULATORY_CARE_PROVIDER_SITE_OTHER): Payer: Self-pay | Admitting: Ophthalmology

## 2021-05-14 MED ORDER — AFLIBERCEPT 2MG/0.05ML IZ SOLN FOR KALEIDOSCOPE
2.0000 mg | INTRAVITREAL | Status: AC | PRN
  Administered 2021-05-14: 2 mg via INTRAVITREAL

## 2021-05-18 DIAGNOSIS — E1142 Type 2 diabetes mellitus with diabetic polyneuropathy: Secondary | ICD-10-CM | POA: Diagnosis not present

## 2021-05-18 DIAGNOSIS — I714 Abdominal aortic aneurysm, without rupture, unspecified: Secondary | ICD-10-CM | POA: Diagnosis not present

## 2021-05-18 DIAGNOSIS — E1122 Type 2 diabetes mellitus with diabetic chronic kidney disease: Secondary | ICD-10-CM | POA: Diagnosis not present

## 2021-05-18 DIAGNOSIS — I10 Essential (primary) hypertension: Secondary | ICD-10-CM | POA: Diagnosis not present

## 2021-05-18 DIAGNOSIS — R260 Ataxic gait: Secondary | ICD-10-CM | POA: Diagnosis not present

## 2021-05-18 DIAGNOSIS — D692 Other nonthrombocytopenic purpura: Secondary | ICD-10-CM | POA: Diagnosis not present

## 2021-05-18 DIAGNOSIS — G8194 Hemiplegia, unspecified affecting left nondominant side: Secondary | ICD-10-CM | POA: Diagnosis not present

## 2021-05-18 DIAGNOSIS — G43909 Migraine, unspecified, not intractable, without status migrainosus: Secondary | ICD-10-CM | POA: Diagnosis not present

## 2021-05-25 DIAGNOSIS — I714 Abdominal aortic aneurysm, without rupture, unspecified: Secondary | ICD-10-CM | POA: Diagnosis not present

## 2021-05-25 DIAGNOSIS — Z8679 Personal history of other diseases of the circulatory system: Secondary | ICD-10-CM | POA: Diagnosis not present

## 2021-06-02 DIAGNOSIS — R059 Cough, unspecified: Secondary | ICD-10-CM | POA: Diagnosis not present

## 2021-06-02 DIAGNOSIS — J9611 Chronic respiratory failure with hypoxia: Secondary | ICD-10-CM | POA: Diagnosis not present

## 2021-06-02 DIAGNOSIS — J440 Chronic obstructive pulmonary disease with acute lower respiratory infection: Secondary | ICD-10-CM | POA: Diagnosis not present

## 2021-06-02 DIAGNOSIS — I10 Essential (primary) hypertension: Secondary | ICD-10-CM | POA: Diagnosis not present
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
## 2021-07-07 ENCOUNTER — Encounter (INDEPENDENT_AMBULATORY_CARE_PROVIDER_SITE_OTHER): Payer: Medicare HMO | Admitting: Ophthalmology

## 2021-07-07 DIAGNOSIS — E113393 Type 2 diabetes mellitus with moderate nonproliferative diabetic retinopathy without macular edema, bilateral: Secondary | ICD-10-CM

## 2021-07-07 DIAGNOSIS — H35033 Hypertensive retinopathy, bilateral: Secondary | ICD-10-CM

## 2021-07-07 DIAGNOSIS — H353231 Exudative age-related macular degeneration, bilateral, with active choroidal neovascularization: Secondary | ICD-10-CM

## 2021-07-07 DIAGNOSIS — I10 Essential (primary) hypertension: Secondary | ICD-10-CM

## 2021-07-07 DIAGNOSIS — H04123 Dry eye syndrome of bilateral lacrimal glands: Secondary | ICD-10-CM

## 2021-07-07 DIAGNOSIS — Z961 Presence of intraocular lens: Secondary | ICD-10-CM
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
## 2021-07-14 ENCOUNTER — Encounter (INDEPENDENT_AMBULATORY_CARE_PROVIDER_SITE_OTHER): Payer: Self-pay

## 2021-07-14 ENCOUNTER — Encounter (INDEPENDENT_AMBULATORY_CARE_PROVIDER_SITE_OTHER): Payer: Medicare HMO | Admitting: Ophthalmology

## 2021-07-14 DIAGNOSIS — H353231 Exudative age-related macular degeneration, bilateral, with active choroidal neovascularization: Secondary | ICD-10-CM

## 2021-07-14 DIAGNOSIS — J9611 Chronic respiratory failure with hypoxia: Secondary | ICD-10-CM | POA: Diagnosis not present

## 2021-07-14 DIAGNOSIS — E1122 Type 2 diabetes mellitus with diabetic chronic kidney disease: Secondary | ICD-10-CM | POA: Diagnosis not present

## 2021-07-14 DIAGNOSIS — I10 Essential (primary) hypertension: Secondary | ICD-10-CM

## 2021-07-14 DIAGNOSIS — Z961 Presence of intraocular lens: Secondary | ICD-10-CM

## 2021-07-14 DIAGNOSIS — H04123 Dry eye syndrome of bilateral lacrimal glands: Secondary | ICD-10-CM

## 2021-07-14 DIAGNOSIS — Z6829 Body mass index (BMI) 29.0-29.9, adult: Secondary | ICD-10-CM | POA: Diagnosis not present

## 2021-07-14 DIAGNOSIS — H35033 Hypertensive retinopathy, bilateral: Secondary | ICD-10-CM

## 2021-07-14 DIAGNOSIS — Z87891 Personal history of nicotine dependence: Secondary | ICD-10-CM | POA: Diagnosis not present

## 2021-07-14 DIAGNOSIS — E113393 Type 2 diabetes mellitus with moderate nonproliferative diabetic retinopathy without macular edema, bilateral: Secondary | ICD-10-CM

## 2021-07-14 DIAGNOSIS — J440 Chronic obstructive pulmonary disease with acute lower respiratory infection: Secondary | ICD-10-CM | POA: Diagnosis not present

## 2021-08-17 DIAGNOSIS — I714 Abdominal aortic aneurysm, without rupture, unspecified: Secondary | ICD-10-CM | POA: Diagnosis not present

## 2021-08-17 DIAGNOSIS — G8194 Hemiplegia, unspecified affecting left nondominant side: Secondary | ICD-10-CM | POA: Diagnosis not present

## 2021-08-17 DIAGNOSIS — E1122 Type 2 diabetes mellitus with diabetic chronic kidney disease: Secondary | ICD-10-CM | POA: Diagnosis not present

## 2021-08-17 DIAGNOSIS — G43909 Migraine, unspecified, not intractable, without status migrainosus: Secondary | ICD-10-CM | POA: Diagnosis not present

## 2021-08-17 DIAGNOSIS — I10 Essential (primary) hypertension: Secondary | ICD-10-CM | POA: Diagnosis not present

## 2021-08-17 DIAGNOSIS — J9611 Chronic respiratory failure with hypoxia: Secondary | ICD-10-CM | POA: Diagnosis not present

## 2021-08-17 DIAGNOSIS — E7849 Other hyperlipidemia: Secondary | ICD-10-CM | POA: Diagnosis not present

## 2021-08-17 DIAGNOSIS — E1142 Type 2 diabetes mellitus with diabetic polyneuropathy: Secondary | ICD-10-CM | POA: Diagnosis not present

## 2021-08-17 DIAGNOSIS — R260 Ataxic gait: Secondary | ICD-10-CM | POA: Diagnosis not present

## 2021-10-08 DIAGNOSIS — E1165 Type 2 diabetes mellitus with hyperglycemia: Secondary | ICD-10-CM | POA: Diagnosis not present

## 2021-10-08 DIAGNOSIS — E782 Mixed hyperlipidemia: Secondary | ICD-10-CM | POA: Diagnosis not present

## 2021-11-03 DIAGNOSIS — D519 Vitamin B12 deficiency anemia, unspecified: Secondary | ICD-10-CM | POA: Diagnosis not present

## 2021-11-03 DIAGNOSIS — E1142 Type 2 diabetes mellitus with diabetic polyneuropathy: Secondary | ICD-10-CM | POA: Diagnosis not present

## 2021-11-03 DIAGNOSIS — E039 Hypothyroidism, unspecified: Secondary | ICD-10-CM | POA: Diagnosis not present

## 2021-11-03 DIAGNOSIS — R0902 Hypoxemia: Secondary | ICD-10-CM | POA: Diagnosis not present

## 2021-11-03 DIAGNOSIS — D649 Anemia, unspecified: Secondary | ICD-10-CM | POA: Diagnosis not present

## 2021-11-03 DIAGNOSIS — E876 Hypokalemia: Secondary | ICD-10-CM | POA: Diagnosis not present

## 2021-11-03 DIAGNOSIS — I10 Essential (primary) hypertension: Secondary | ICD-10-CM | POA: Diagnosis not present

## 2021-11-03 DIAGNOSIS — K2100 Gastro-esophageal reflux disease with esophagitis, without bleeding: Secondary | ICD-10-CM | POA: Diagnosis not present

## 2021-11-03 DIAGNOSIS — E7849 Other hyperlipidemia: Secondary | ICD-10-CM | POA: Diagnosis not present

## 2021-11-07 DIAGNOSIS — E782 Mixed hyperlipidemia: Secondary | ICD-10-CM | POA: Diagnosis not present

## 2021-11-07 DIAGNOSIS — E1165 Type 2 diabetes mellitus with hyperglycemia: Secondary | ICD-10-CM | POA: Diagnosis not present

## 2021-11-17 DIAGNOSIS — I739 Peripheral vascular disease, unspecified: Secondary | ICD-10-CM | POA: Diagnosis not present

## 2021-11-17 DIAGNOSIS — N1832 Chronic kidney disease, stage 3b: Secondary | ICD-10-CM | POA: Diagnosis not present

## 2021-11-17 DIAGNOSIS — E7849 Other hyperlipidemia: Secondary | ICD-10-CM | POA: Diagnosis not present

## 2021-11-17 DIAGNOSIS — R260 Ataxic gait: Secondary | ICD-10-CM | POA: Diagnosis not present

## 2021-11-17 DIAGNOSIS — E1122 Type 2 diabetes mellitus with diabetic chronic kidney disease: Secondary | ICD-10-CM | POA: Diagnosis not present

## 2021-11-17 DIAGNOSIS — F331 Major depressive disorder, recurrent, moderate: Secondary | ICD-10-CM | POA: Diagnosis not present

## 2021-11-17 DIAGNOSIS — J9611 Chronic respiratory failure with hypoxia: Secondary | ICD-10-CM | POA: Diagnosis not present

## 2021-11-17 DIAGNOSIS — I10 Essential (primary) hypertension: Secondary | ICD-10-CM | POA: Diagnosis not present

## 2021-11-17 DIAGNOSIS — F17210 Nicotine dependence, cigarettes, uncomplicated: Secondary | ICD-10-CM | POA: Diagnosis not present

## 2021-11-17 DIAGNOSIS — G43909 Migraine, unspecified, not intractable, without status migrainosus: Secondary | ICD-10-CM | POA: Diagnosis not present

## 2021-11-17 DIAGNOSIS — Z836 Family history of other diseases of the respiratory system: Secondary | ICD-10-CM | POA: Diagnosis not present

## 2021-11-17 DIAGNOSIS — J449 Chronic obstructive pulmonary disease, unspecified: Secondary | ICD-10-CM | POA: Diagnosis not present

## 2021-12-04 DIAGNOSIS — J9611 Chronic respiratory failure with hypoxia: Secondary | ICD-10-CM | POA: Diagnosis not present

## 2021-12-04 DIAGNOSIS — I509 Heart failure, unspecified: Secondary | ICD-10-CM | POA: Diagnosis not present

## 2021-12-04 DIAGNOSIS — J449 Chronic obstructive pulmonary disease, unspecified: Secondary | ICD-10-CM | POA: Diagnosis not present

## 2021-12-04 DIAGNOSIS — E1122 Type 2 diabetes mellitus with diabetic chronic kidney disease: Secondary | ICD-10-CM | POA: Diagnosis not present

## 2021-12-04 DIAGNOSIS — E1151 Type 2 diabetes mellitus with diabetic peripheral angiopathy without gangrene: Secondary | ICD-10-CM | POA: Diagnosis not present

## 2021-12-04 DIAGNOSIS — M25512 Pain in left shoulder: Secondary | ICD-10-CM | POA: Diagnosis not present

## 2021-12-04 DIAGNOSIS — N1832 Chronic kidney disease, stage 3b: Secondary | ICD-10-CM | POA: Diagnosis not present

## 2021-12-04 DIAGNOSIS — I130 Hypertensive heart and chronic kidney disease with heart failure and stage 1 through stage 4 chronic kidney disease, or unspecified chronic kidney disease: Secondary | ICD-10-CM | POA: Diagnosis not present

## 2021-12-07 DIAGNOSIS — Z23 Encounter for immunization: Secondary | ICD-10-CM | POA: Diagnosis not present

## 2021-12-07 IMAGING — PT NM PET TUM IMG INITIAL (PI) SKULL BASE T - THIGH
7 series · 25 of 25 positions shown · non-contrast
Comparison: Chest CT 03/06/2020

CLINICAL DATA: Initial treatment strategy for pulmonary nodule.
LEFT lung lesion 13.5 mm.

EXAM:
NUCLEAR MEDICINE PET SKULL BASE TO THIGH
TECHNIQUE: 9.2 mCi F-18 FDG was injected intravenously. Full-ring PET imaging
was performed from the skull base to thigh after the radiotracer. CT
data was obtained and used for attenuation correction and anatomic
localization.
Fasting blood glucose: 152 mg/dl

[Series 3: pet sk_thigh ac · axial · 5.0mm · 4.07mm/px · z∈[-1183,-223]mm · 6 of 241 slices shown]
[im 1/241]
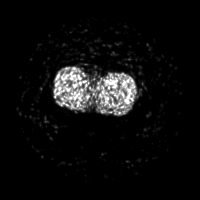
[im 49/241]
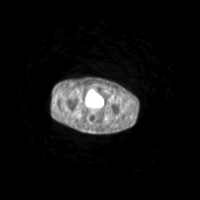
[im 97/241]
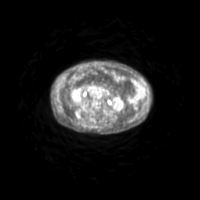
[im 145/241]
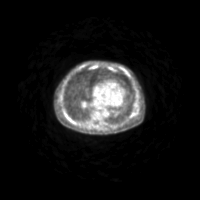
[im 193/241]
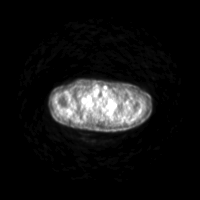
[im 241/241]
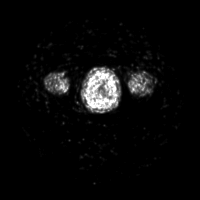

[Series 4: ct sk_thigh 5.0 bf37 · axial · 5.0mm · 0.98mm/px · z∈[-1183,-223]mm · 5 of 241 slices shown]
[im 1/241]
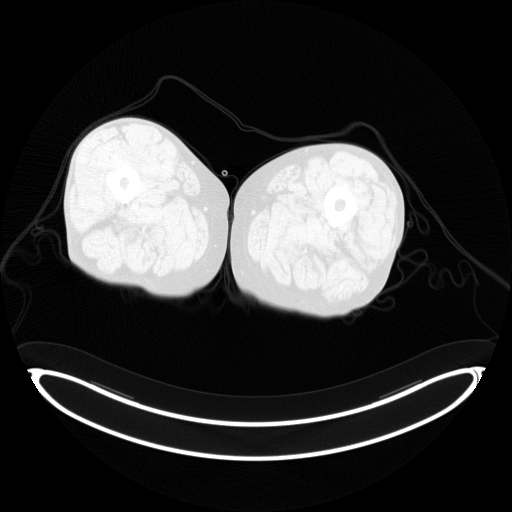
[im 61/241]
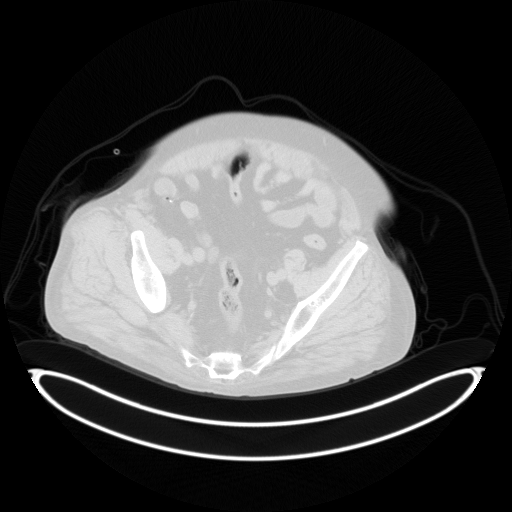
[im 121/241]
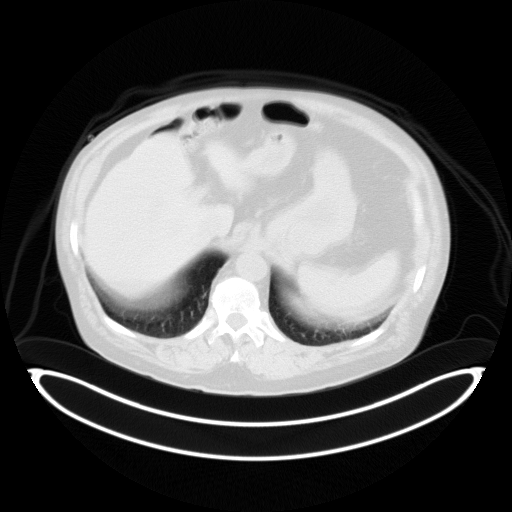
[im 181/241]
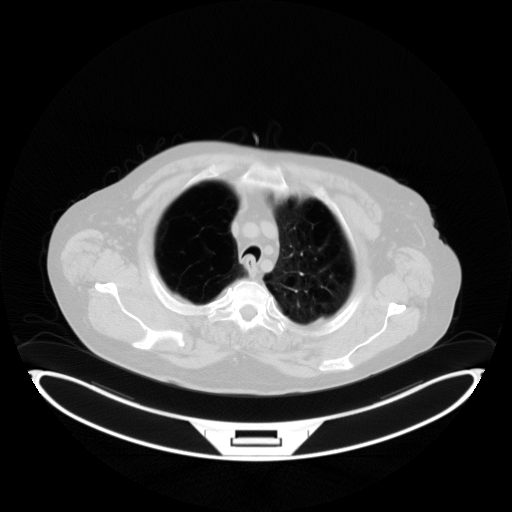
[im 241/241  brain]
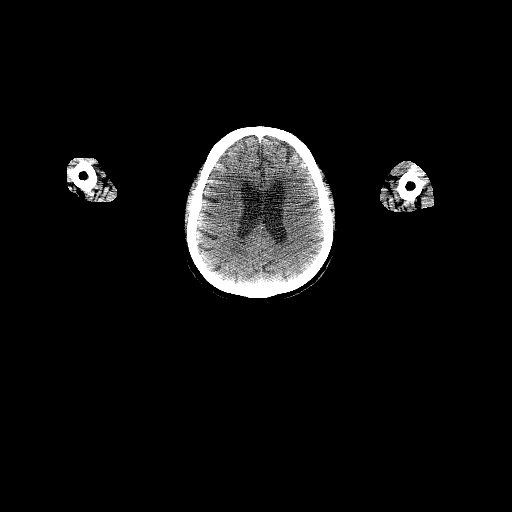

[Series 5: pet sk_thigh nac · axial · 5.0mm · 4.07mm/px · z∈[-1183,-223]mm · 5 of 241 slices shown]
[im 1/241]
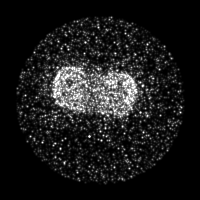
[im 61/241]
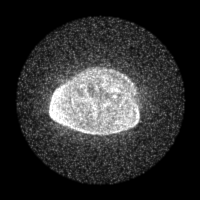
[im 121/241]
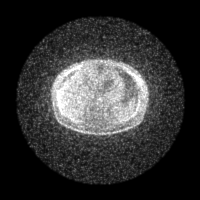
[im 181/241]
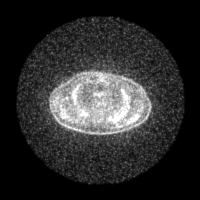
[im 241/241]
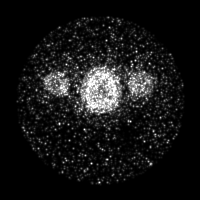

[Series 8: ct sk_thigh 5.0 br59 lung_bone · axial · 5.0mm · 0.73mm/px · z∈[-717,-417]mm · 2 of 76 slices shown]
[im 1/76]
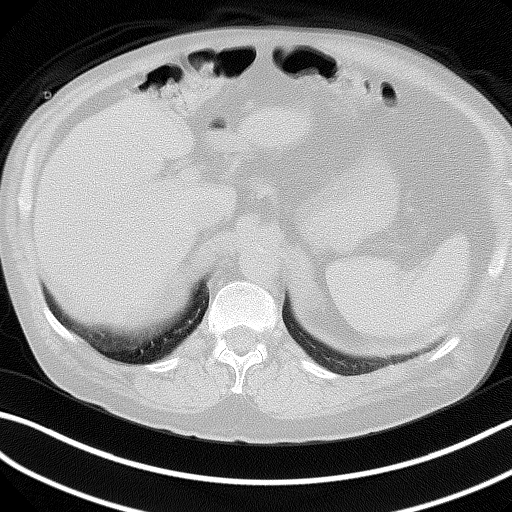
[im 76/76]
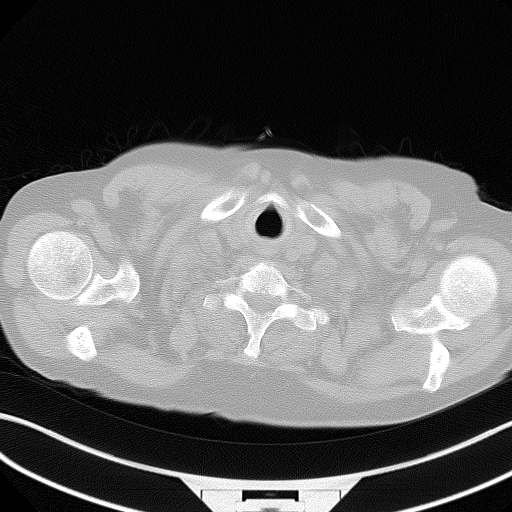

[Series 603: fused cor · 1 of 34 slices shown]
[im 1/34]
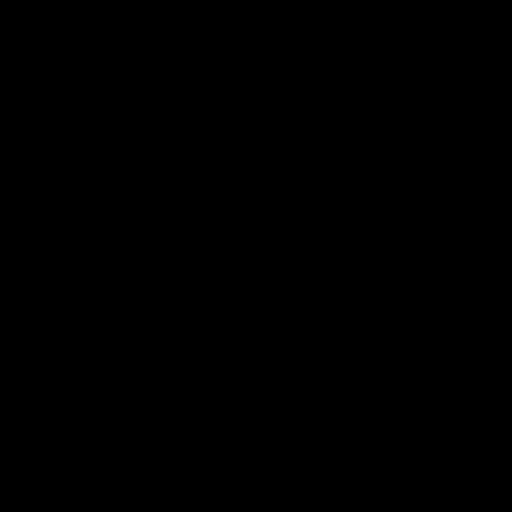

[Series 604: <mip collection> · coronal · 1.99mm/px · 1 of 32 slices shown]
[im 1/32]
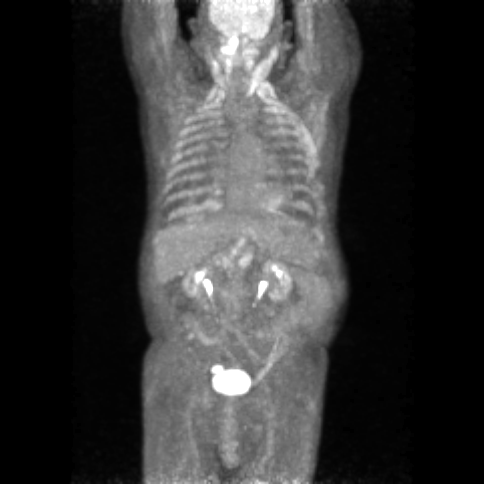

[Series 605: range-ct sk_thigh 5.0 bf37-tra-<alpha range> · 5 of 232 slices shown]
[im 1/232]
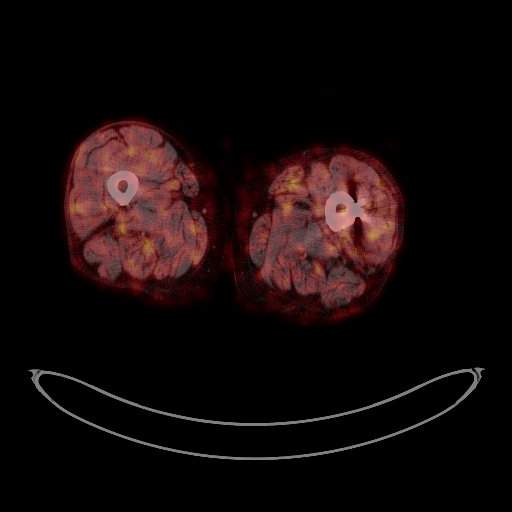
[im 58/232]
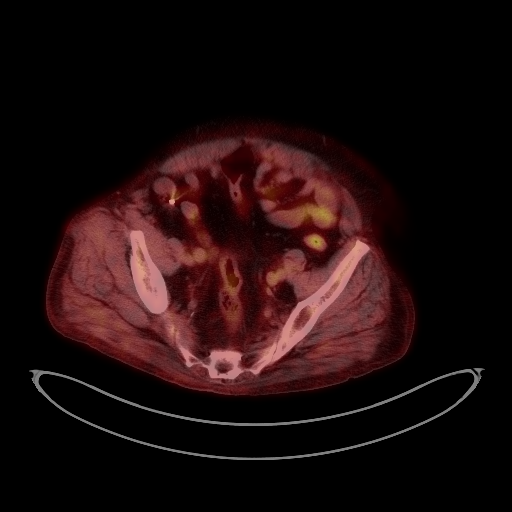
[im 116/232]
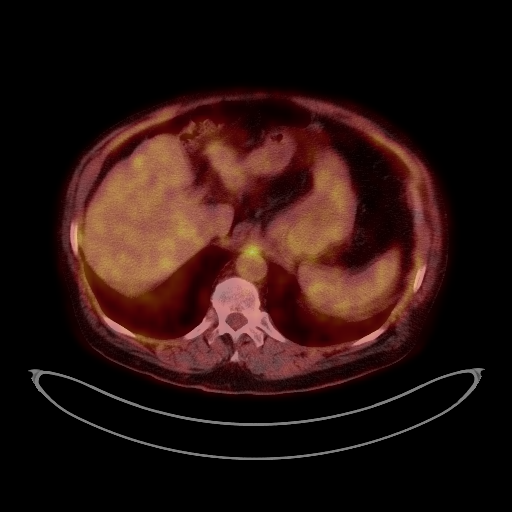
[im 174/232]
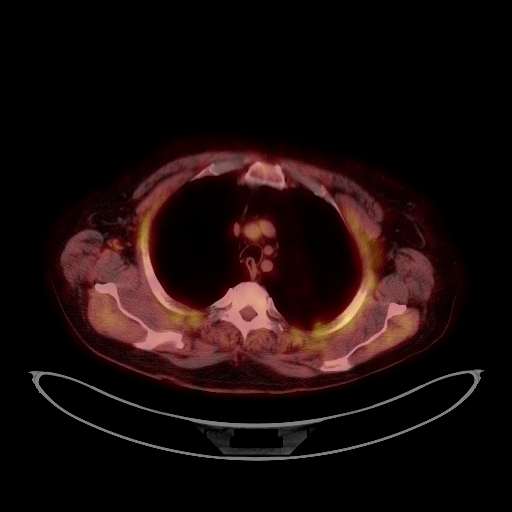
[im 232/232]
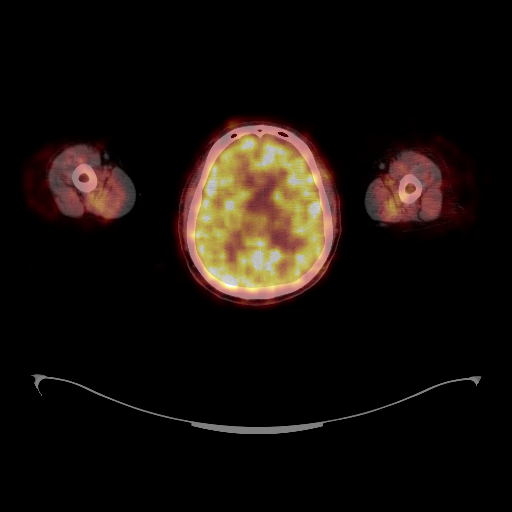

[25 of 25 positions shown; findings below may reference images not displayed]

FINDINGS: Mediastinal blood pool activity: SUV max

Liver activity: SUV max NA

NECK: No hypermetabolic lymph nodes in the neck.

Incidental CT findings: none

CHEST: Region of pleuroparenchymal thickening in the inferior
lingula is less conspicuous than on comparison exam CT chest. There
is a nodular focus measuring 13 mm on image 102/series 4 which may
correlate to the previous described abnormality. There is no
metabolic activity associated with this inferior lingular
nodularity.

No hypermetabolic nodules elsewhere in the lung.

Severe centrilobular emphysema the upper lobes.

Incidental CT findings: Several healed LEFT lateral rib fractures

ABDOMEN/PELVIS: No abnormal hypermetabolic activity within the
liver, pancreas, adrenal glands, or spleen. No hypermetabolic lymph
nodes in the abdomen or pelvis.

Incidental CT findings: Atherosclerotic calcification of the aorta.
Postcholecystectomy

SKELETON: No aggressive osseous lesion.

Incidental CT findings: none
IMPRESSION: 1. No metabolic activity associated with mild nodular thickening in
the inferior lingula. Findings most consistent benign scarring.
Recommend continued low-dose annual CT lung cancer screening.
2. No evidence of malignancy on skull base to thigh FDG PET scan.

## 2021-12-29 DIAGNOSIS — K921 Melena: Secondary | ICD-10-CM | POA: Diagnosis not present

## 2022-03-08 DIAGNOSIS — Z125 Encounter for screening for malignant neoplasm of prostate: Secondary | ICD-10-CM | POA: Diagnosis not present

## 2022-03-08 DIAGNOSIS — N1832 Chronic kidney disease, stage 3b: Secondary | ICD-10-CM | POA: Diagnosis not present

## 2022-03-08 DIAGNOSIS — I10 Essential (primary) hypertension: Secondary | ICD-10-CM | POA: Diagnosis not present

## 2022-03-08 DIAGNOSIS — E7849 Other hyperlipidemia: Secondary | ICD-10-CM | POA: Diagnosis not present

## 2022-03-08 DIAGNOSIS — J449 Chronic obstructive pulmonary disease, unspecified: Secondary | ICD-10-CM | POA: Diagnosis not present

## 2022-03-08 DIAGNOSIS — E039 Hypothyroidism, unspecified: Secondary | ICD-10-CM | POA: Diagnosis not present

## 2022-03-08 DIAGNOSIS — Z0001 Encounter for general adult medical examination with abnormal findings: Secondary | ICD-10-CM | POA: Diagnosis not present

## 2022-03-08 DIAGNOSIS — K2100 Gastro-esophageal reflux disease with esophagitis, without bleeding: Secondary | ICD-10-CM | POA: Diagnosis not present

## 2022-03-08 DIAGNOSIS — E1122 Type 2 diabetes mellitus with diabetic chronic kidney disease: Secondary | ICD-10-CM | POA: Diagnosis not present

## 2022-03-17 DIAGNOSIS — E7849 Other hyperlipidemia: Secondary | ICD-10-CM | POA: Diagnosis not present

## 2022-03-17 DIAGNOSIS — R260 Ataxic gait: Secondary | ICD-10-CM | POA: Diagnosis not present

## 2022-03-17 DIAGNOSIS — J9611 Chronic respiratory failure with hypoxia: Secondary | ICD-10-CM | POA: Diagnosis not present

## 2022-03-17 DIAGNOSIS — E1122 Type 2 diabetes mellitus with diabetic chronic kidney disease: Secondary | ICD-10-CM | POA: Diagnosis not present

## 2022-03-17 DIAGNOSIS — Z0001 Encounter for general adult medical examination with abnormal findings: Secondary | ICD-10-CM | POA: Diagnosis not present

## 2022-03-17 DIAGNOSIS — F17210 Nicotine dependence, cigarettes, uncomplicated: Secondary | ICD-10-CM | POA: Diagnosis not present

## 2022-03-17 DIAGNOSIS — J449 Chronic obstructive pulmonary disease, unspecified: Secondary | ICD-10-CM | POA: Diagnosis not present

## 2022-03-17 DIAGNOSIS — I10 Essential (primary) hypertension: Secondary | ICD-10-CM | POA: Diagnosis not present

## 2022-03-17 DIAGNOSIS — G8194 Hemiplegia, unspecified affecting left nondominant side: Secondary | ICD-10-CM | POA: Diagnosis not present

## 2022-03-29 DIAGNOSIS — R0902 Hypoxemia: Secondary | ICD-10-CM | POA: Diagnosis not present

## 2022-03-29 DIAGNOSIS — R260 Ataxic gait: Secondary | ICD-10-CM | POA: Diagnosis not present

## 2022-03-29 DIAGNOSIS — M6281 Muscle weakness (generalized): Secondary | ICD-10-CM | POA: Diagnosis not present

## 2022-03-29 DIAGNOSIS — G4733 Obstructive sleep apnea (adult) (pediatric): Secondary | ICD-10-CM | POA: Diagnosis not present

## 2022-03-29 DIAGNOSIS — J449 Chronic obstructive pulmonary disease, unspecified: Secondary | ICD-10-CM | POA: Diagnosis not present

## 2022-04-06 DIAGNOSIS — Z122 Encounter for screening for malignant neoplasm of respiratory organs: Secondary | ICD-10-CM | POA: Diagnosis not present

## 2022-04-06 DIAGNOSIS — F17210 Nicotine dependence, cigarettes, uncomplicated: Secondary | ICD-10-CM | POA: Diagnosis not present

## 2022-04-12 DIAGNOSIS — E083393 Diabetes mellitus due to underlying condition with moderate nonproliferative diabetic retinopathy without macular edema, bilateral: Secondary | ICD-10-CM | POA: Diagnosis not present

## 2022-04-12 DIAGNOSIS — H524 Presbyopia: Secondary | ICD-10-CM | POA: Diagnosis not present

## 2022-04-27 DIAGNOSIS — M6281 Muscle weakness (generalized): Secondary | ICD-10-CM | POA: Diagnosis not present

## 2022-04-27 DIAGNOSIS — R260 Ataxic gait: Secondary | ICD-10-CM | POA: Diagnosis not present

## 2022-04-27 DIAGNOSIS — R0902 Hypoxemia: Secondary | ICD-10-CM | POA: Diagnosis not present

## 2022-04-27 DIAGNOSIS — J449 Chronic obstructive pulmonary disease, unspecified: Secondary | ICD-10-CM | POA: Diagnosis not present

## 2022-04-27 DIAGNOSIS — G4733 Obstructive sleep apnea (adult) (pediatric): Secondary | ICD-10-CM | POA: Diagnosis not present

## 2022-05-28 DIAGNOSIS — R260 Ataxic gait: Secondary | ICD-10-CM | POA: Diagnosis not present

## 2022-05-28 DIAGNOSIS — M6281 Muscle weakness (generalized): Secondary | ICD-10-CM | POA: Diagnosis not present

## 2022-05-28 DIAGNOSIS — J449 Chronic obstructive pulmonary disease, unspecified: Secondary | ICD-10-CM | POA: Diagnosis not present

## 2022-05-28 DIAGNOSIS — R0902 Hypoxemia: Secondary | ICD-10-CM | POA: Diagnosis not present

## 2022-05-28 DIAGNOSIS — G4733 Obstructive sleep apnea (adult) (pediatric): Secondary | ICD-10-CM | POA: Diagnosis not present

## 2022-06-27 DIAGNOSIS — G4733 Obstructive sleep apnea (adult) (pediatric): Secondary | ICD-10-CM | POA: Diagnosis not present

## 2022-06-27 DIAGNOSIS — M6281 Muscle weakness (generalized): Secondary | ICD-10-CM | POA: Diagnosis not present

## 2022-06-27 DIAGNOSIS — R260 Ataxic gait: Secondary | ICD-10-CM | POA: Diagnosis not present

## 2022-06-27 DIAGNOSIS — R0902 Hypoxemia: Secondary | ICD-10-CM | POA: Diagnosis not present

## 2022-06-27 DIAGNOSIS — J449 Chronic obstructive pulmonary disease, unspecified: Secondary | ICD-10-CM | POA: Diagnosis not present

## 2022-07-26 DIAGNOSIS — E039 Hypothyroidism, unspecified: Secondary | ICD-10-CM | POA: Diagnosis not present

## 2022-07-26 DIAGNOSIS — E1165 Type 2 diabetes mellitus with hyperglycemia: Secondary | ICD-10-CM | POA: Diagnosis not present

## 2022-07-26 DIAGNOSIS — E1122 Type 2 diabetes mellitus with diabetic chronic kidney disease: Secondary | ICD-10-CM | POA: Diagnosis not present

## 2022-07-26 DIAGNOSIS — E7849 Other hyperlipidemia: Secondary | ICD-10-CM | POA: Diagnosis not present

## 2022-07-26 DIAGNOSIS — N184 Chronic kidney disease, stage 4 (severe): Secondary | ICD-10-CM | POA: Diagnosis not present

## 2022-07-26 DIAGNOSIS — K2100 Gastro-esophageal reflux disease with esophagitis, without bleeding: Secondary | ICD-10-CM | POA: Diagnosis not present

## 2022-07-28 DIAGNOSIS — R260 Ataxic gait: Secondary | ICD-10-CM | POA: Diagnosis not present

## 2022-07-28 DIAGNOSIS — G4733 Obstructive sleep apnea (adult) (pediatric): Secondary | ICD-10-CM | POA: Diagnosis not present

## 2022-07-28 DIAGNOSIS — R0902 Hypoxemia: Secondary | ICD-10-CM | POA: Diagnosis not present

## 2022-07-28 DIAGNOSIS — J449 Chronic obstructive pulmonary disease, unspecified: Secondary | ICD-10-CM | POA: Diagnosis not present

## 2022-07-28 DIAGNOSIS — M6281 Muscle weakness (generalized): Secondary | ICD-10-CM | POA: Diagnosis not present

## 2022-08-03 DIAGNOSIS — E7849 Other hyperlipidemia: Secondary | ICD-10-CM | POA: Diagnosis not present

## 2022-08-03 DIAGNOSIS — D692 Other nonthrombocytopenic purpura: Secondary | ICD-10-CM | POA: Diagnosis not present

## 2022-08-03 DIAGNOSIS — Z836 Family history of other diseases of the respiratory system: Secondary | ICD-10-CM | POA: Diagnosis not present

## 2022-08-03 DIAGNOSIS — G43909 Migraine, unspecified, not intractable, without status migrainosus: Secondary | ICD-10-CM | POA: Diagnosis not present

## 2022-08-03 DIAGNOSIS — J449 Chronic obstructive pulmonary disease, unspecified: Secondary | ICD-10-CM | POA: Diagnosis not present

## 2022-08-03 DIAGNOSIS — I10 Essential (primary) hypertension: Secondary | ICD-10-CM | POA: Diagnosis not present

## 2022-08-03 DIAGNOSIS — F17210 Nicotine dependence, cigarettes, uncomplicated: Secondary | ICD-10-CM | POA: Diagnosis not present

## 2022-08-03 DIAGNOSIS — G8194 Hemiplegia, unspecified affecting left nondominant side: Secondary | ICD-10-CM | POA: Diagnosis not present

## 2022-08-03 DIAGNOSIS — I739 Peripheral vascular disease, unspecified: Secondary | ICD-10-CM | POA: Diagnosis not present

## 2022-08-03 DIAGNOSIS — E1122 Type 2 diabetes mellitus with diabetic chronic kidney disease: Secondary | ICD-10-CM | POA: Diagnosis not present

## 2022-08-03 DIAGNOSIS — G4733 Obstructive sleep apnea (adult) (pediatric): Secondary | ICD-10-CM | POA: Diagnosis not present

## 2022-08-03 DIAGNOSIS — F331 Major depressive disorder, recurrent, moderate: Secondary | ICD-10-CM | POA: Diagnosis not present

## 2022-08-03 DIAGNOSIS — J9611 Chronic respiratory failure with hypoxia: Secondary | ICD-10-CM | POA: Diagnosis not present

## 2022-08-03 DIAGNOSIS — E6609 Other obesity due to excess calories: Secondary | ICD-10-CM | POA: Diagnosis not present

## 2022-08-03 DIAGNOSIS — E782 Mixed hyperlipidemia: Secondary | ICD-10-CM | POA: Diagnosis not present

## 2022-08-03 DIAGNOSIS — R260 Ataxic gait: Secondary | ICD-10-CM | POA: Diagnosis not present

## 2022-08-27 DIAGNOSIS — M6281 Muscle weakness (generalized): Secondary | ICD-10-CM | POA: Diagnosis not present

## 2022-08-27 DIAGNOSIS — J449 Chronic obstructive pulmonary disease, unspecified: Secondary | ICD-10-CM | POA: Diagnosis not present

## 2022-08-27 DIAGNOSIS — G4733 Obstructive sleep apnea (adult) (pediatric): Secondary | ICD-10-CM | POA: Diagnosis not present

## 2022-08-27 DIAGNOSIS — R0902 Hypoxemia: Secondary | ICD-10-CM | POA: Diagnosis not present

## 2022-08-27 DIAGNOSIS — R260 Ataxic gait: Secondary | ICD-10-CM | POA: Diagnosis not present

## 2022-09-08 DIAGNOSIS — G4733 Obstructive sleep apnea (adult) (pediatric): Secondary | ICD-10-CM | POA: Diagnosis not present

## 2022-09-27 DIAGNOSIS — G4733 Obstructive sleep apnea (adult) (pediatric): Secondary | ICD-10-CM | POA: Diagnosis not present

## 2022-09-27 DIAGNOSIS — R0902 Hypoxemia: Secondary | ICD-10-CM | POA: Diagnosis not present

## 2022-09-27 DIAGNOSIS — M6281 Muscle weakness (generalized): Secondary | ICD-10-CM | POA: Diagnosis not present

## 2022-09-27 DIAGNOSIS — R260 Ataxic gait: Secondary | ICD-10-CM | POA: Diagnosis not present

## 2022-09-27 DIAGNOSIS — J449 Chronic obstructive pulmonary disease, unspecified: Secondary | ICD-10-CM | POA: Diagnosis not present

## 2022-10-21 DIAGNOSIS — G4733 Obstructive sleep apnea (adult) (pediatric): Secondary | ICD-10-CM | POA: Diagnosis not present

## 2022-10-28 DIAGNOSIS — R260 Ataxic gait: Secondary | ICD-10-CM | POA: Diagnosis not present

## 2022-10-28 DIAGNOSIS — J449 Chronic obstructive pulmonary disease, unspecified: Secondary | ICD-10-CM | POA: Diagnosis not present

## 2022-10-28 DIAGNOSIS — G4733 Obstructive sleep apnea (adult) (pediatric): Secondary | ICD-10-CM | POA: Diagnosis not present

## 2022-10-28 DIAGNOSIS — R0902 Hypoxemia: Secondary | ICD-10-CM | POA: Diagnosis not present

## 2022-10-28 DIAGNOSIS — M6281 Muscle weakness (generalized): Secondary | ICD-10-CM | POA: Diagnosis not present

## 2022-11-27 DIAGNOSIS — M6281 Muscle weakness (generalized): Secondary | ICD-10-CM | POA: Diagnosis not present

## 2022-11-27 DIAGNOSIS — G4733 Obstructive sleep apnea (adult) (pediatric): Secondary | ICD-10-CM | POA: Diagnosis not present

## 2022-11-27 DIAGNOSIS — J449 Chronic obstructive pulmonary disease, unspecified: Secondary | ICD-10-CM | POA: Diagnosis not present

## 2022-11-27 DIAGNOSIS — R0902 Hypoxemia: Secondary | ICD-10-CM | POA: Diagnosis not present

## 2022-11-27 DIAGNOSIS — R260 Ataxic gait: Secondary | ICD-10-CM | POA: Diagnosis not present

## 2022-12-02 DIAGNOSIS — N184 Chronic kidney disease, stage 4 (severe): Secondary | ICD-10-CM | POA: Diagnosis not present

## 2022-12-02 DIAGNOSIS — K2100 Gastro-esophageal reflux disease with esophagitis, without bleeding: Secondary | ICD-10-CM | POA: Diagnosis not present

## 2022-12-02 DIAGNOSIS — E1165 Type 2 diabetes mellitus with hyperglycemia: Secondary | ICD-10-CM | POA: Diagnosis not present

## 2022-12-02 DIAGNOSIS — E782 Mixed hyperlipidemia: Secondary | ICD-10-CM | POA: Diagnosis not present

## 2022-12-02 DIAGNOSIS — E876 Hypokalemia: Secondary | ICD-10-CM | POA: Diagnosis not present

## 2022-12-07 DIAGNOSIS — D692 Other nonthrombocytopenic purpura: Secondary | ICD-10-CM | POA: Diagnosis not present

## 2022-12-07 DIAGNOSIS — E7849 Other hyperlipidemia: Secondary | ICD-10-CM | POA: Diagnosis not present

## 2022-12-07 DIAGNOSIS — N1832 Chronic kidney disease, stage 3b: Secondary | ICD-10-CM | POA: Diagnosis not present

## 2022-12-07 DIAGNOSIS — R260 Ataxic gait: Secondary | ICD-10-CM | POA: Diagnosis not present

## 2022-12-07 DIAGNOSIS — R4582 Worries: Secondary | ICD-10-CM | POA: Diagnosis not present

## 2022-12-07 DIAGNOSIS — F17210 Nicotine dependence, cigarettes, uncomplicated: Secondary | ICD-10-CM | POA: Diagnosis not present

## 2022-12-07 DIAGNOSIS — E1122 Type 2 diabetes mellitus with diabetic chronic kidney disease: Secondary | ICD-10-CM | POA: Diagnosis not present

## 2022-12-07 DIAGNOSIS — I10 Essential (primary) hypertension: Secondary | ICD-10-CM | POA: Diagnosis not present

## 2022-12-07 DIAGNOSIS — G8194 Hemiplegia, unspecified affecting left nondominant side: Secondary | ICD-10-CM | POA: Diagnosis not present

## 2022-12-07 DIAGNOSIS — G43909 Migraine, unspecified, not intractable, without status migrainosus: Secondary | ICD-10-CM | POA: Diagnosis not present

## 2022-12-07 DIAGNOSIS — Z23 Encounter for immunization: Secondary | ICD-10-CM | POA: Diagnosis not present

## 2022-12-07 DIAGNOSIS — J9611 Chronic respiratory failure with hypoxia: Secondary | ICD-10-CM | POA: Diagnosis not present

## 2022-12-28 DIAGNOSIS — M6281 Muscle weakness (generalized): Secondary | ICD-10-CM | POA: Diagnosis not present

## 2022-12-28 DIAGNOSIS — J449 Chronic obstructive pulmonary disease, unspecified: Secondary | ICD-10-CM | POA: Diagnosis not present

## 2022-12-28 DIAGNOSIS — R260 Ataxic gait: Secondary | ICD-10-CM | POA: Diagnosis not present

## 2022-12-28 DIAGNOSIS — G4733 Obstructive sleep apnea (adult) (pediatric): Secondary | ICD-10-CM | POA: Diagnosis not present

## 2022-12-28 DIAGNOSIS — R0902 Hypoxemia: Secondary | ICD-10-CM | POA: Diagnosis not present

## 2023-01-27 DIAGNOSIS — R0902 Hypoxemia: Secondary | ICD-10-CM | POA: Diagnosis not present

## 2023-01-27 DIAGNOSIS — M6281 Muscle weakness (generalized): Secondary | ICD-10-CM | POA: Diagnosis not present

## 2023-01-27 DIAGNOSIS — J449 Chronic obstructive pulmonary disease, unspecified: Secondary | ICD-10-CM | POA: Diagnosis not present

## 2023-01-27 DIAGNOSIS — R260 Ataxic gait: Secondary | ICD-10-CM | POA: Diagnosis not present

## 2023-01-27 DIAGNOSIS — G4733 Obstructive sleep apnea (adult) (pediatric): Secondary | ICD-10-CM | POA: Diagnosis not present

## 2023-01-29 DIAGNOSIS — G4733 Obstructive sleep apnea (adult) (pediatric): Secondary | ICD-10-CM | POA: Diagnosis not present

## 2023-04-14 DIAGNOSIS — Z0000 Encounter for general adult medical examination without abnormal findings: Secondary | ICD-10-CM | POA: Diagnosis not present

## 2023-04-23 DIAGNOSIS — D559 Anemia due to enzyme disorder, unspecified: Secondary | ICD-10-CM | POA: Diagnosis not present

## 2023-04-23 DIAGNOSIS — Z0001 Encounter for general adult medical examination with abnormal findings: Secondary | ICD-10-CM | POA: Diagnosis not present

## 2023-04-23 DIAGNOSIS — M25532 Pain in left wrist: Secondary | ICD-10-CM | POA: Diagnosis not present

## 2023-04-23 DIAGNOSIS — E782 Mixed hyperlipidemia: Secondary | ICD-10-CM | POA: Diagnosis not present

## 2023-04-23 DIAGNOSIS — F17210 Nicotine dependence, cigarettes, uncomplicated: Secondary | ICD-10-CM | POA: Diagnosis not present

## 2023-04-23 DIAGNOSIS — D649 Anemia, unspecified: Secondary | ICD-10-CM | POA: Diagnosis not present

## 2023-04-23 DIAGNOSIS — E039 Hypothyroidism, unspecified: Secondary | ICD-10-CM | POA: Diagnosis not present

## 2023-04-23 DIAGNOSIS — N1832 Chronic kidney disease, stage 3b: Secondary | ICD-10-CM | POA: Diagnosis not present

## 2023-05-24 DIAGNOSIS — Z122 Encounter for screening for malignant neoplasm of respiratory organs: Secondary | ICD-10-CM | POA: Diagnosis not present

## 2023-05-24 DIAGNOSIS — R918 Other nonspecific abnormal finding of lung field: Secondary | ICD-10-CM | POA: Diagnosis not present

## 2023-05-24 DIAGNOSIS — J432 Centrilobular emphysema: Secondary | ICD-10-CM | POA: Diagnosis not present

## 2023-05-24 DIAGNOSIS — F17210 Nicotine dependence, cigarettes, uncomplicated: Secondary | ICD-10-CM | POA: Diagnosis not present

## 2023-05-24 DIAGNOSIS — Z87891 Personal history of nicotine dependence: Secondary | ICD-10-CM | POA: Diagnosis not present

## 2023-08-09 DIAGNOSIS — E039 Hypothyroidism, unspecified: Secondary | ICD-10-CM | POA: Diagnosis not present

## 2023-08-09 DIAGNOSIS — E1122 Type 2 diabetes mellitus with diabetic chronic kidney disease: Secondary | ICD-10-CM | POA: Diagnosis not present

## 2023-08-09 DIAGNOSIS — I10 Essential (primary) hypertension: Secondary | ICD-10-CM | POA: Diagnosis not present

## 2023-08-23 DIAGNOSIS — E1165 Type 2 diabetes mellitus with hyperglycemia: Secondary | ICD-10-CM | POA: Diagnosis not present

## 2023-08-23 DIAGNOSIS — E7849 Other hyperlipidemia: Secondary | ICD-10-CM | POA: Diagnosis not present

## 2023-08-23 DIAGNOSIS — D649 Anemia, unspecified: Secondary | ICD-10-CM | POA: Diagnosis not present

## 2023-08-23 DIAGNOSIS — D559 Anemia due to enzyme disorder, unspecified: Secondary | ICD-10-CM | POA: Diagnosis not present

## 2023-08-23 DIAGNOSIS — E876 Hypokalemia: Secondary | ICD-10-CM | POA: Diagnosis not present

## 2023-08-30 DIAGNOSIS — E782 Mixed hyperlipidemia: Secondary | ICD-10-CM | POA: Diagnosis not present

## 2023-08-30 DIAGNOSIS — F17210 Nicotine dependence, cigarettes, uncomplicated: Secondary | ICD-10-CM | POA: Diagnosis not present

## 2023-08-30 DIAGNOSIS — E039 Hypothyroidism, unspecified: Secondary | ICD-10-CM | POA: Diagnosis not present

## 2023-08-30 DIAGNOSIS — D649 Anemia, unspecified: Secondary | ICD-10-CM | POA: Diagnosis not present

## 2023-08-30 DIAGNOSIS — N1832 Chronic kidney disease, stage 3b: Secondary | ICD-10-CM | POA: Diagnosis not present

## 2023-08-31 DIAGNOSIS — H6122 Impacted cerumen, left ear: Secondary | ICD-10-CM | POA: Diagnosis not present

## 2023-08-31 DIAGNOSIS — K2100 Gastro-esophageal reflux disease with esophagitis, without bleeding: Secondary | ICD-10-CM | POA: Diagnosis not present

## 2023-08-31 DIAGNOSIS — E039 Hypothyroidism, unspecified: Secondary | ICD-10-CM | POA: Diagnosis not present

## 2023-08-31 DIAGNOSIS — E1122 Type 2 diabetes mellitus with diabetic chronic kidney disease: Secondary | ICD-10-CM | POA: Diagnosis not present

## 2023-08-31 DIAGNOSIS — M25532 Pain in left wrist: Secondary | ICD-10-CM | POA: Diagnosis not present

## 2023-08-31 DIAGNOSIS — J449 Chronic obstructive pulmonary disease, unspecified: Secondary | ICD-10-CM | POA: Diagnosis not present

## 2023-08-31 DIAGNOSIS — E7849 Other hyperlipidemia: Secondary | ICD-10-CM | POA: Diagnosis not present

## 2023-08-31 DIAGNOSIS — G8194 Hemiplegia, unspecified affecting left nondominant side: Secondary | ICD-10-CM | POA: Diagnosis not present

## 2023-08-31 DIAGNOSIS — D559 Anemia due to enzyme disorder, unspecified: Secondary | ICD-10-CM | POA: Diagnosis not present

## 2023-08-31 DIAGNOSIS — F17210 Nicotine dependence, cigarettes, uncomplicated: Secondary | ICD-10-CM | POA: Diagnosis not present

## 2023-08-31 DIAGNOSIS — I10 Essential (primary) hypertension: Secondary | ICD-10-CM | POA: Diagnosis not present

## 2023-09-09 DIAGNOSIS — K2100 Gastro-esophageal reflux disease with esophagitis, without bleeding: Secondary | ICD-10-CM | POA: Diagnosis not present

## 2023-09-09 DIAGNOSIS — E039 Hypothyroidism, unspecified: Secondary | ICD-10-CM | POA: Diagnosis not present

## 2023-09-09 DIAGNOSIS — E1122 Type 2 diabetes mellitus with diabetic chronic kidney disease: Secondary | ICD-10-CM | POA: Diagnosis not present

## 2023-09-09 DIAGNOSIS — I10 Essential (primary) hypertension: Secondary | ICD-10-CM | POA: Diagnosis not present

## 2023-09-13 DIAGNOSIS — E039 Hypothyroidism, unspecified: Secondary | ICD-10-CM | POA: Diagnosis not present

## 2023-09-13 DIAGNOSIS — M25532 Pain in left wrist: Secondary | ICD-10-CM | POA: Diagnosis not present

## 2023-09-13 DIAGNOSIS — G8194 Hemiplegia, unspecified affecting left nondominant side: Secondary | ICD-10-CM | POA: Diagnosis not present

## 2023-09-13 DIAGNOSIS — E7849 Other hyperlipidemia: Secondary | ICD-10-CM | POA: Diagnosis not present

## 2023-09-13 DIAGNOSIS — K2100 Gastro-esophageal reflux disease with esophagitis, without bleeding: Secondary | ICD-10-CM | POA: Diagnosis not present

## 2023-09-13 DIAGNOSIS — H353230 Exudative age-related macular degeneration, bilateral, stage unspecified: Secondary | ICD-10-CM | POA: Diagnosis not present

## 2023-09-13 DIAGNOSIS — R7989 Other specified abnormal findings of blood chemistry: Secondary | ICD-10-CM | POA: Diagnosis not present

## 2023-09-13 DIAGNOSIS — E1122 Type 2 diabetes mellitus with diabetic chronic kidney disease: Secondary | ICD-10-CM | POA: Diagnosis not present

## 2023-09-13 DIAGNOSIS — J449 Chronic obstructive pulmonary disease, unspecified: Secondary | ICD-10-CM | POA: Diagnosis not present

## 2023-09-13 DIAGNOSIS — D559 Anemia due to enzyme disorder, unspecified: Secondary | ICD-10-CM | POA: Diagnosis not present

## 2023-09-13 DIAGNOSIS — I10 Essential (primary) hypertension: Secondary | ICD-10-CM | POA: Diagnosis not present

## 2023-10-10 DIAGNOSIS — E7849 Other hyperlipidemia: Secondary | ICD-10-CM | POA: Diagnosis not present

## 2023-10-10 DIAGNOSIS — I10 Essential (primary) hypertension: Secondary | ICD-10-CM | POA: Diagnosis not present

## 2023-10-10 DIAGNOSIS — E1122 Type 2 diabetes mellitus with diabetic chronic kidney disease: Secondary | ICD-10-CM | POA: Diagnosis not present

## 2023-10-10 DIAGNOSIS — E039 Hypothyroidism, unspecified: Secondary | ICD-10-CM | POA: Diagnosis not present

## 2023-10-11 DIAGNOSIS — M25532 Pain in left wrist: Secondary | ICD-10-CM | POA: Diagnosis not present

## 2023-10-11 DIAGNOSIS — I10 Essential (primary) hypertension: Secondary | ICD-10-CM | POA: Diagnosis not present

## 2023-10-11 DIAGNOSIS — E1122 Type 2 diabetes mellitus with diabetic chronic kidney disease: Secondary | ICD-10-CM | POA: Diagnosis not present

## 2023-10-11 DIAGNOSIS — J449 Chronic obstructive pulmonary disease, unspecified: Secondary | ICD-10-CM | POA: Diagnosis not present

## 2023-10-11 DIAGNOSIS — D559 Anemia due to enzyme disorder, unspecified: Secondary | ICD-10-CM | POA: Diagnosis not present

## 2023-10-11 DIAGNOSIS — H353230 Exudative age-related macular degeneration, bilateral, stage unspecified: Secondary | ICD-10-CM | POA: Diagnosis not present

## 2023-10-11 DIAGNOSIS — E039 Hypothyroidism, unspecified: Secondary | ICD-10-CM | POA: Diagnosis not present

## 2023-10-11 DIAGNOSIS — E7849 Other hyperlipidemia: Secondary | ICD-10-CM | POA: Diagnosis not present

## 2023-10-11 DIAGNOSIS — G8194 Hemiplegia, unspecified affecting left nondominant side: Secondary | ICD-10-CM | POA: Diagnosis not present

## 2023-10-11 DIAGNOSIS — R7989 Other specified abnormal findings of blood chemistry: Secondary | ICD-10-CM | POA: Diagnosis not present

## 2023-10-11 DIAGNOSIS — K2100 Gastro-esophageal reflux disease with esophagitis, without bleeding: Secondary | ICD-10-CM | POA: Diagnosis not present

## 2023-10-28 DIAGNOSIS — J449 Chronic obstructive pulmonary disease, unspecified: Secondary | ICD-10-CM | POA: Diagnosis not present

## 2023-10-28 DIAGNOSIS — E1122 Type 2 diabetes mellitus with diabetic chronic kidney disease: Secondary | ICD-10-CM | POA: Diagnosis not present

## 2023-10-28 DIAGNOSIS — M25532 Pain in left wrist: Secondary | ICD-10-CM | POA: Diagnosis not present

## 2023-10-28 DIAGNOSIS — R7989 Other specified abnormal findings of blood chemistry: Secondary | ICD-10-CM | POA: Diagnosis not present

## 2023-10-28 DIAGNOSIS — E7849 Other hyperlipidemia: Secondary | ICD-10-CM | POA: Diagnosis not present

## 2023-10-28 DIAGNOSIS — K2100 Gastro-esophageal reflux disease with esophagitis, without bleeding: Secondary | ICD-10-CM | POA: Diagnosis not present

## 2023-10-28 DIAGNOSIS — I10 Essential (primary) hypertension: Secondary | ICD-10-CM | POA: Diagnosis not present

## 2023-10-28 DIAGNOSIS — E039 Hypothyroidism, unspecified: Secondary | ICD-10-CM | POA: Diagnosis not present

## 2023-10-28 DIAGNOSIS — G8194 Hemiplegia, unspecified affecting left nondominant side: Secondary | ICD-10-CM | POA: Diagnosis not present

## 2023-10-28 DIAGNOSIS — H353230 Exudative age-related macular degeneration, bilateral, stage unspecified: Secondary | ICD-10-CM | POA: Diagnosis not present

## 2023-10-28 DIAGNOSIS — D559 Anemia due to enzyme disorder, unspecified: Secondary | ICD-10-CM | POA: Diagnosis not present

## 2023-11-15 DIAGNOSIS — R7989 Other specified abnormal findings of blood chemistry: Secondary | ICD-10-CM | POA: Diagnosis not present

## 2023-11-15 DIAGNOSIS — K2100 Gastro-esophageal reflux disease with esophagitis, without bleeding: Secondary | ICD-10-CM | POA: Diagnosis not present

## 2023-11-15 DIAGNOSIS — E039 Hypothyroidism, unspecified: Secondary | ICD-10-CM | POA: Diagnosis not present

## 2023-11-15 DIAGNOSIS — J449 Chronic obstructive pulmonary disease, unspecified: Secondary | ICD-10-CM | POA: Diagnosis not present

## 2023-11-15 DIAGNOSIS — H353230 Exudative age-related macular degeneration, bilateral, stage unspecified: Secondary | ICD-10-CM | POA: Diagnosis not present

## 2023-11-15 DIAGNOSIS — E7849 Other hyperlipidemia: Secondary | ICD-10-CM | POA: Diagnosis not present

## 2023-11-15 DIAGNOSIS — I10 Essential (primary) hypertension: Secondary | ICD-10-CM | POA: Diagnosis not present

## 2023-11-15 DIAGNOSIS — D559 Anemia due to enzyme disorder, unspecified: Secondary | ICD-10-CM | POA: Diagnosis not present

## 2023-11-15 DIAGNOSIS — M25532 Pain in left wrist: Secondary | ICD-10-CM | POA: Diagnosis not present

## 2023-11-15 DIAGNOSIS — G8194 Hemiplegia, unspecified affecting left nondominant side: Secondary | ICD-10-CM | POA: Diagnosis not present

## 2023-11-15 DIAGNOSIS — E1122 Type 2 diabetes mellitus with diabetic chronic kidney disease: Secondary | ICD-10-CM | POA: Diagnosis not present

## 2023-12-06 DIAGNOSIS — G8194 Hemiplegia, unspecified affecting left nondominant side: Secondary | ICD-10-CM | POA: Diagnosis not present

## 2023-12-06 DIAGNOSIS — E7849 Other hyperlipidemia: Secondary | ICD-10-CM | POA: Diagnosis not present

## 2023-12-06 DIAGNOSIS — E1122 Type 2 diabetes mellitus with diabetic chronic kidney disease: Secondary | ICD-10-CM | POA: Diagnosis not present

## 2023-12-06 DIAGNOSIS — J449 Chronic obstructive pulmonary disease, unspecified: Secondary | ICD-10-CM | POA: Diagnosis not present

## 2023-12-06 DIAGNOSIS — M25532 Pain in left wrist: Secondary | ICD-10-CM | POA: Diagnosis not present

## 2023-12-06 DIAGNOSIS — H353230 Exudative age-related macular degeneration, bilateral, stage unspecified: Secondary | ICD-10-CM | POA: Diagnosis not present

## 2023-12-06 DIAGNOSIS — D559 Anemia due to enzyme disorder, unspecified: Secondary | ICD-10-CM | POA: Diagnosis not present

## 2023-12-06 DIAGNOSIS — R7989 Other specified abnormal findings of blood chemistry: Secondary | ICD-10-CM | POA: Diagnosis not present

## 2023-12-06 DIAGNOSIS — K2100 Gastro-esophageal reflux disease with esophagitis, without bleeding: Secondary | ICD-10-CM | POA: Diagnosis not present

## 2023-12-06 DIAGNOSIS — I10 Essential (primary) hypertension: Secondary | ICD-10-CM | POA: Diagnosis not present

## 2023-12-06 DIAGNOSIS — E039 Hypothyroidism, unspecified: Secondary | ICD-10-CM | POA: Diagnosis not present
